# Patient Record
Sex: Female | Born: 1955 | Hispanic: Refuse to answer | Marital: Married | Smoking: Former smoker
Health system: Southern US, Community
[De-identification: ages and names within clinical notes are randomized; demographics above are authoritative.]

## PROBLEM LIST (undated history)

## (undated) DIAGNOSIS — T753XXA Motion sickness, initial encounter: Secondary | ICD-10-CM

## (undated) DIAGNOSIS — R569 Unspecified convulsions: Secondary | ICD-10-CM

## (undated) DIAGNOSIS — M797 Fibromyalgia: Secondary | ICD-10-CM

## (undated) DIAGNOSIS — E785 Hyperlipidemia, unspecified: Secondary | ICD-10-CM

## (undated) DIAGNOSIS — I10 Essential (primary) hypertension: Secondary | ICD-10-CM

## (undated) DIAGNOSIS — R002 Palpitations: Secondary | ICD-10-CM

## (undated) DIAGNOSIS — M069 Rheumatoid arthritis, unspecified: Secondary | ICD-10-CM

## (undated) HISTORY — PX: ABLATION: SHX5711

## (undated) HISTORY — PX: CHOLECYSTECTOMY: SHX55

---

## 2007-12-23 ENCOUNTER — Ambulatory Visit: Payer: Self-pay | Admitting: Family Medicine

## 2010-04-05 ENCOUNTER — Ambulatory Visit: Payer: Self-pay | Admitting: Otolaryngology

## 2010-06-15 ENCOUNTER — Ambulatory Visit: Payer: Self-pay | Admitting: Anesthesiology

## 2010-06-16 ENCOUNTER — Ambulatory Visit: Payer: Self-pay | Admitting: Otolaryngology

## 2010-06-16 HISTORY — PX: NASAL SINUS SURGERY: SHX719

## 2010-06-28 ENCOUNTER — Ambulatory Visit: Payer: Self-pay | Admitting: Cardiovascular Disease

## 2010-08-12 ENCOUNTER — Ambulatory Visit: Payer: Self-pay | Admitting: Internal Medicine

## 2012-05-02 ENCOUNTER — Ambulatory Visit: Payer: Self-pay

## 2012-05-15 IMAGING — CT CT MAXILLOFACIAL WITHOUT CONTRAST
2 of 3 series · 15 of 40 positions shown, 18 images · non-contrast
Comparison: none

REASON FOR EXAM: CHRONIC PAN SINUSITIS
COMMENTS:

[Series 10: (person_name) 1mm · axial · 0.32mm/px · z∈[-61,+60]mm · 12 of 135 slices shown, 15 images]
[im 7/135  brain]
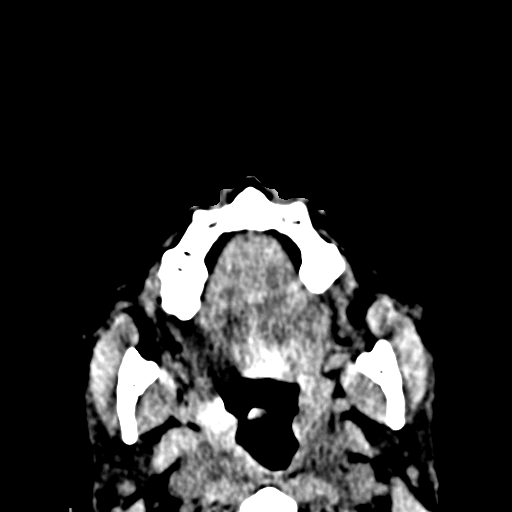
[im 7/135  bone]
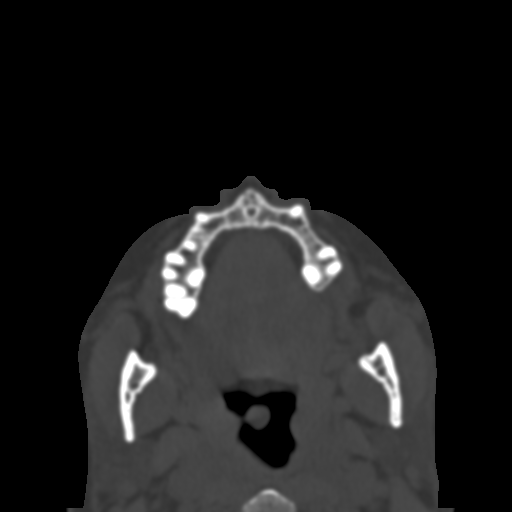
[im 19/135  bone]
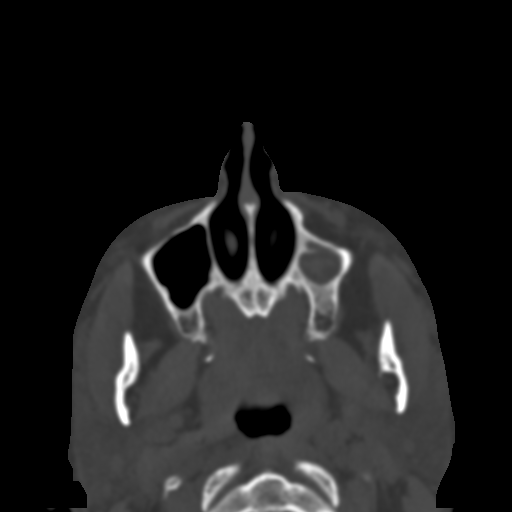
[im 31/135  bone]
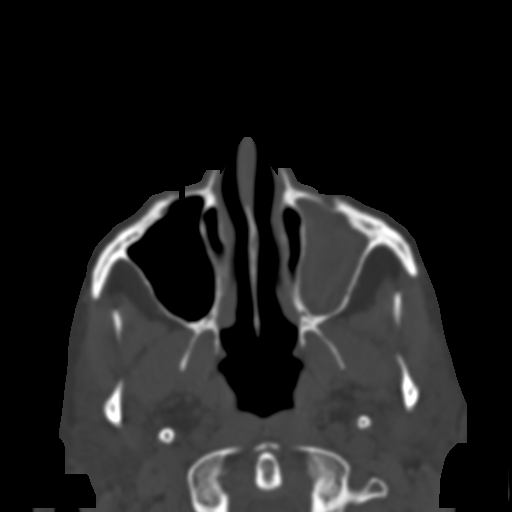
[im 43/135  bone]
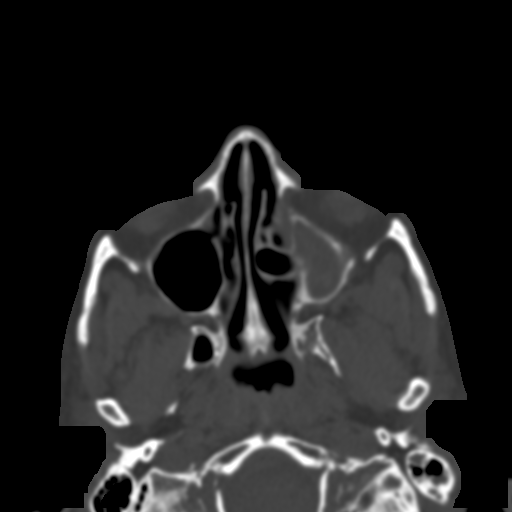
[im 49/135  brain]
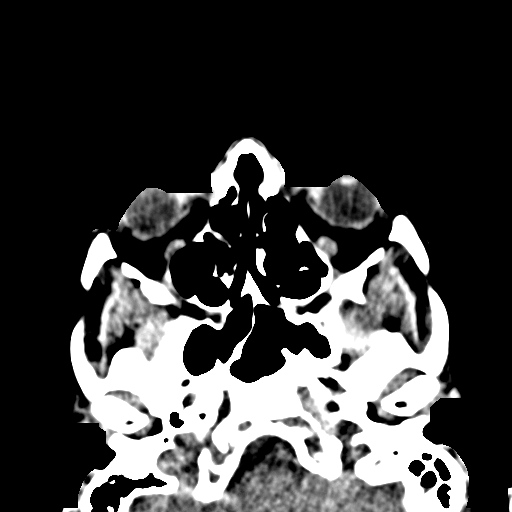
[im 49/135  bone]
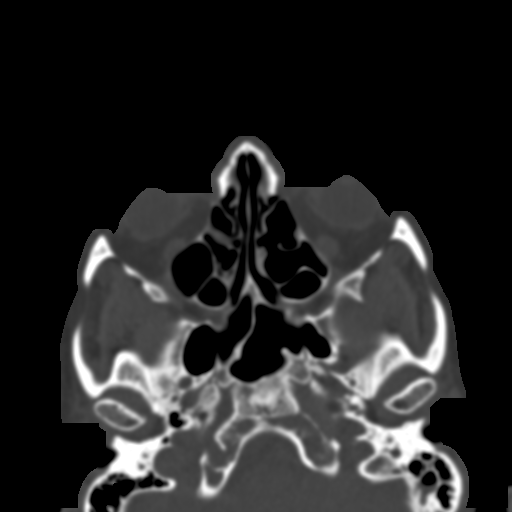
[im 61/135  bone]
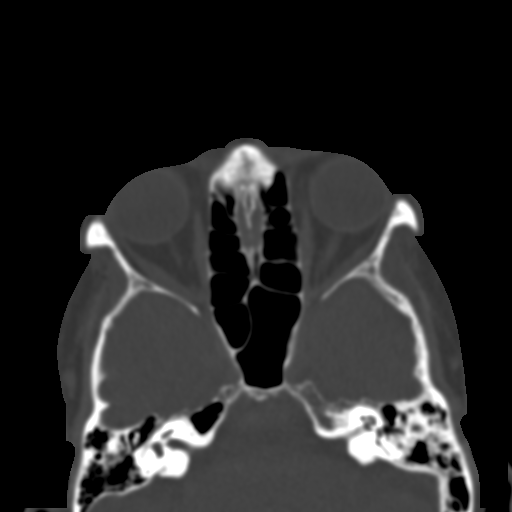
[im 74/135  bone]
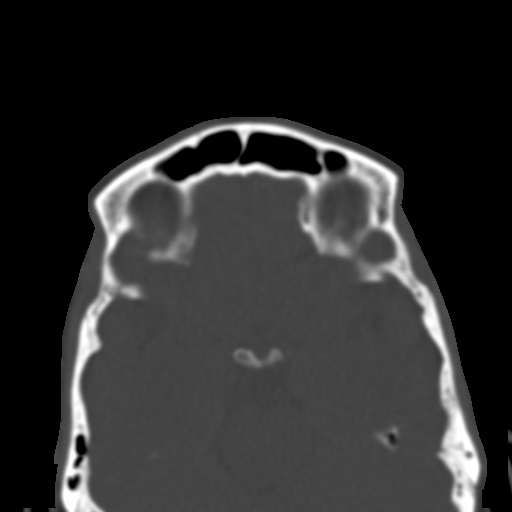
[im 86/135  bone]
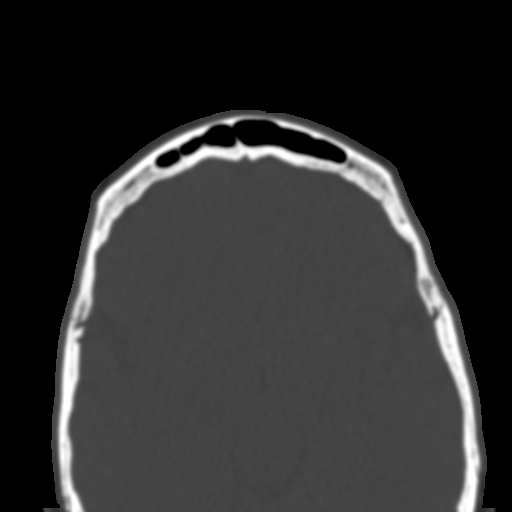
[im 92/135  brain]
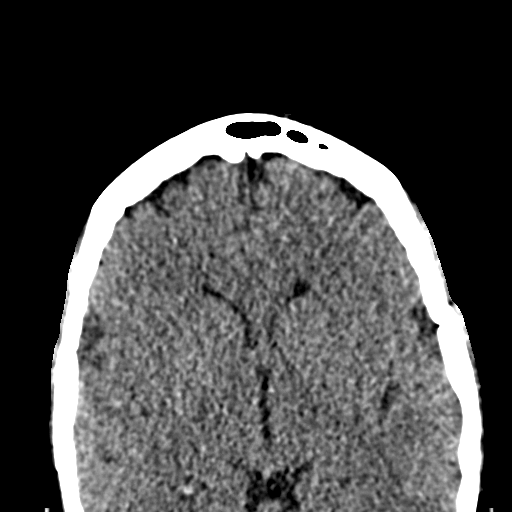
[im 92/135  bone]
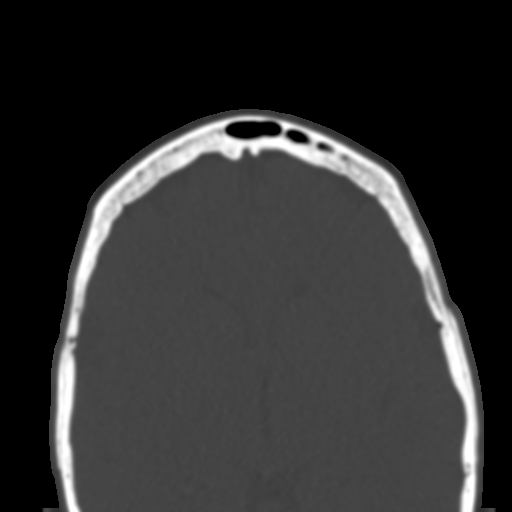
[im 104/135  bone]
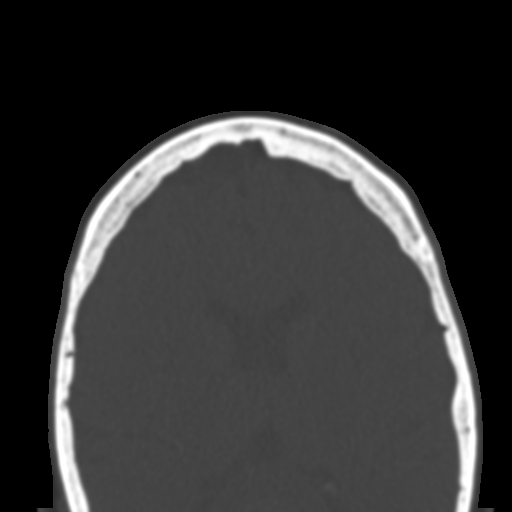
[im 116/135  bone]
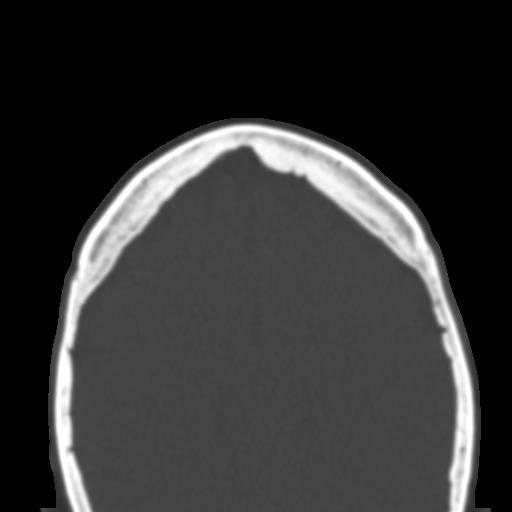
[im 128/135  bone]
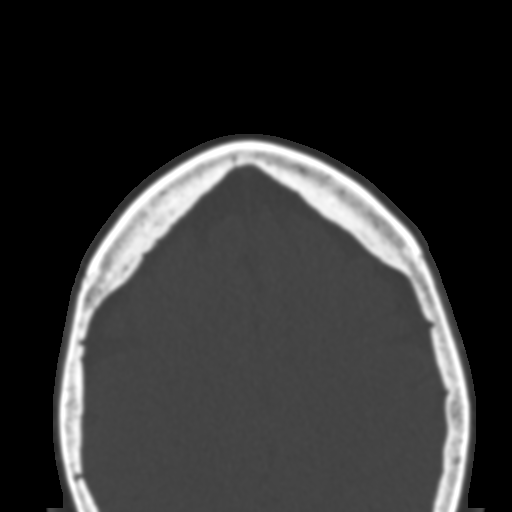

[Series 602: coronals · coronal · 0.32mm/px · 3 of 42 slices shown]
[im 14/42  bone]
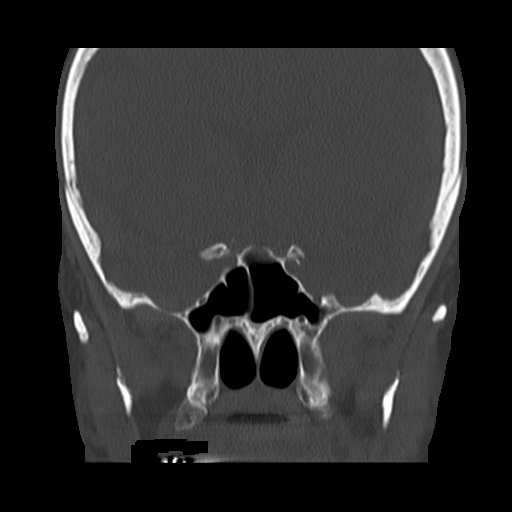
[im 19/42  bone]
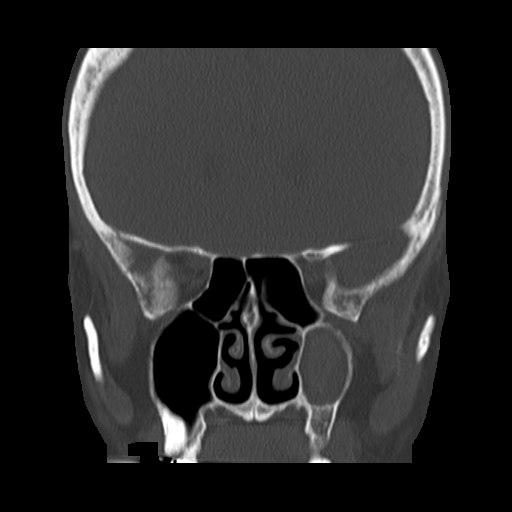
[im 28/42  bone]
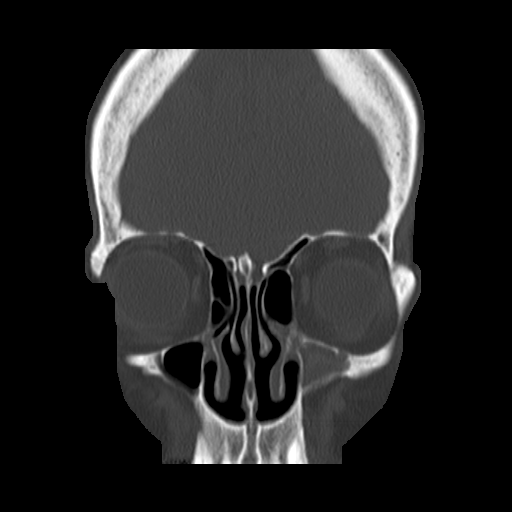

[15 of 40 positions shown; findings below may reference images not displayed]

PROCEDURE:     DAVID - DAVID MAXILLOFACIAL ([facility])  - April 05, 2010  [DATE]

RESULT:     Axial CT scanning was performed from the maxilla through the
frontal sinuses at 3 mm intervals and slice thicknesses using a bone
algorithm. Coronal reconstructions were obtained.

The left maxillary sinus is nearly totally opacified. The material present
is slightly radiodense suggesting that is inspissated and likely chronic.
The right maxillary sinus demonstrates a tiny amount of mucoperiosteal
thickening inferiorly. The frontal, ethmoid, and sphenoid sinuses are clear.
The ostiomeatal unit of the right maxillary sinus is patent while that on
the left contains soft tissue density material. The nasal septum is midline.
IMPRESSION: There is near total opacification of the left maxillary
sinus which may reflect both acute and chronic inflammation. There is a tiny
amount of mucoperiosteal thickening inferiorly in the right maxillary sinus.

## 2013-10-29 ENCOUNTER — Ambulatory Visit: Payer: Self-pay | Admitting: Family Medicine

## 2014-08-10 ENCOUNTER — Ambulatory Visit: Payer: Self-pay | Admitting: Family Medicine

## 2017-05-03 ENCOUNTER — Other Ambulatory Visit: Payer: Self-pay | Admitting: Otolaryngology

## 2017-05-03 DIAGNOSIS — J329 Chronic sinusitis, unspecified: Secondary | ICD-10-CM

## 2017-05-16 ENCOUNTER — Ambulatory Visit
Admission: RE | Admit: 2017-05-16 | Discharge: 2017-05-16 | Disposition: A | Discharge status: Home / Self Care | Payer: Medicare PPO | Source: Ambulatory Visit | Attending: Otolaryngology | Admitting: Otolaryngology

## 2017-05-16 DIAGNOSIS — J329 Chronic sinusitis, unspecified: Secondary | ICD-10-CM | POA: Insufficient documentation

## 2017-05-16 DIAGNOSIS — R238 Other skin changes: Secondary | ICD-10-CM | POA: Diagnosis not present

## 2017-07-05 ENCOUNTER — Ambulatory Visit: Admission: RE | Admit: 2017-07-05 | Payer: Medicare PPO | Source: Ambulatory Visit | Admitting: Otolaryngology

## 2017-07-05 ENCOUNTER — Encounter: Admission: RE | Payer: Self-pay | Source: Ambulatory Visit

## 2017-07-05 SURGERY — SINUS SURGERY, WITH IMAGING GUIDANCE
Anesthesia: General

## 2018-01-18 ENCOUNTER — Other Ambulatory Visit (HOSPITAL_COMMUNITY): Payer: Self-pay | Admitting: Otolaryngology

## 2018-01-18 DIAGNOSIS — J339 Nasal polyp, unspecified: Secondary | ICD-10-CM

## 2018-01-28 ENCOUNTER — Encounter (INDEPENDENT_AMBULATORY_CARE_PROVIDER_SITE_OTHER): Payer: Self-pay

## 2018-01-28 ENCOUNTER — Ambulatory Visit
Admission: RE | Admit: 2018-01-28 | Discharge: 2018-01-28 | Disposition: A | Discharge status: Home / Self Care | Payer: Medicare PPO | Source: Ambulatory Visit | Attending: Otolaryngology | Admitting: Otolaryngology

## 2018-01-28 DIAGNOSIS — J320 Chronic maxillary sinusitis: Secondary | ICD-10-CM | POA: Diagnosis not present

## 2018-01-28 DIAGNOSIS — J339 Nasal polyp, unspecified: Secondary | ICD-10-CM | POA: Insufficient documentation

## 2018-02-13 NOTE — Discharge Instructions (Signed)
[facility] °[facility] °ENDOSCOPIC SINUS SURGERY °[facility] ° °What is Functional Endoscopic Sinus Surgery? ° The Surgery involves making the natural openings of the sinuses larger by removing the bony partitions that separate the sinuses from the nasal cavity.  The natural sinus lining is preserved as much as possible to allow the sinuses to resume normal function after the surgery.  In some patients nasal polyps (excessively swollen lining of the sinuses) may be removed to relieve obstruction of the sinus openings.  The surgery is performed through the nose using lighted scopes, which eliminates the need for incisions on the face.  A septoplasty is a different procedure which is sometimes performed with sinus surgery.  It involves straightening the boy partition that separates the two sides of your nose.  A crooked or deviated septum may need repair if is obstructing the sinuses or nasal airflow.  Turbinate reduction is also often performed during sinus surgery.  The turbinates are bony proturberances from the side walls of the nose which swell and can obstruct the nose in patients with sinus and allergy problems.  Their size can be surgically reduced to help relieve nasal obstruction. ° °What Can Sinus Surgery Do For Me? ° Sinus surgery can reduce the frequency of sinus infections requiring antibiotic treatment.  This can provide improvement in nasal congestion, post-nasal drainage, facial pressure and nasal obstruction.  Surgery will NOT prevent you from ever having an infection again, so it usually only for patients who get infections 4 or more times yearly requiring antibiotics, or for infections that do not clear with antibiotics.  It will not cure nasal allergies, so patients with allergies may still require medication to treat their allergies after surgery. Surgery may improve headaches related to sinusitis, however, some people will continue to  require medication to control sinus headaches related to allergies.  Surgery will do nothing for other forms of headache (migraine, tension or cluster). ° °What Are the Risks of Endoscopic Sinus Surgery? ° Current techniques allow surgery to be performed safely with little risk, however, there are rare complications that patients should be aware of.  Because the sinuses are located around the eyes, there is risk of eye injury, including blindness, though again, this would be quite rare. This is usually a result of bleeding behind the eye during surgery, which puts the vision oat risk, though there are treatments to protect the vision and prevent permanent disrupted by surgery causing a leak of the spinal fluid that surrounds the brain.  More serious complications would include bleeding inside the brain cavity or damage to the brain.  Again, all of these complications are uncommon, and spinal fluid leaks can be safely managed surgically if they occur.  The most common complication of sinus surgery is bleeding from the nose, which may require packing or cauterization of the nose.  Continued sinus have polyps may experience recurrence of the polyps requiring revision surgery.  Alterations of sense of smell or injury to the tear ducts are also rare complications.  ° °What is the Surgery Like, and what is the Recovery? ° The Surgery usually takes a couple of hours to perform, and is usually performed under a general anesthetic (completely asleep).  Patients are usually discharged home after a couple of hours.  Sometimes during surgery it is necessary to pack the nose to control bleeding, and the packing is left in place for 24 - 48 hours, and removed by your surgeon.    If a septoplasty was performed during the procedure, there is often a splint placed which must be removed after 5-7 days.   °Discomfort: Pain is usually mild to moderate, and can be controlled by prescription pain medication or acetaminophen (Tylenol).   Aspirin, Ibuprofen (Advil, Motrin), or Naprosyn (Aleve) should be avoided, as they can cause increased bleeding.  Most patients feel sinus pressure like they have a bad head cold for several days.  Sleeping with your head elevated can help reduce swelling and facial pressure, as can ice packs over the face.  A humidifier may be helpful to keep the mucous and blood from drying in the nose.  ° °Diet: There are no specific diet restrictions, however, you should generally start with clear liquids and a light diet of bland foods because the anesthetic can cause some nausea.  Advance your diet depending on how your stomach feels.  Taking your pain medication with food will often help reduce stomach upset which pain medications can cause. ° °Nasal Saline Irrigation: It is important to remove blood clots and dried mucous from the nose as it is healing.  This is done by having you irrigate the nose at least 3 - 4 times daily with a salt water solution.  We recommend using NeilMed Sinus Rinse (available at the drug store).  Fill the squeeze bottle with the solution, bend over a sink, and insert the tip of the squeeze bottle into the nose ½ of an inch.  Point the tip of the squeeze bottle towards the inside corner of the eye on the same side your irrigating.  Squeeze the bottle and gently irrigate the nose.  If you bend forward as you do this, most of the fluid will flow back out of the nose, instead of down your throat.   The solution should be warm, near body temperature, when you irrigate.   Each time you irrigate, you should use a full squeeze bottle.  ° °Note that if you are instructed to use Nasal Steroid Sprays at any time after your surgery, irrigate with saline BEFORE using the steroid spray, so you do not wash it all out of the nose. °Another product, Nasal Saline Gel (such as AYR Nasal Saline Gel) can be applied in each nostril 3 - 4 times daily to moisture the nose and reduce scabbing or crusting. ° °Bleeding:   Bloody drainage from the nose can be expected for several days, and patients are instructed to irrigate their nose frequently with salt water to help remove mucous and blood clots.  The drainage may be dark red or brown, though some fresh blood may be seen intermittently, especially after irrigation.  Do not blow you nose, as bleeding may occur. If you must sneeze, keep your mouth open to allow air to escape through your mouth. ° °If heavy bleeding occurs: Irrigate the nose with saline to rinse out clots, then spray the nose 3 - 4 times with Afrin Nasal Decongestant Spray.  The spray will constrict the blood vessels to slow bleeding.  Pinch the lower half of your nose shut to apply pressure, and lay down with your head elevated.  Ice packs over the nose may help as well. If bleeding persists despite these measures, you should notify your doctor.  Do not use the Afrin routinely to control nasal congestion after surgery, as it can result in worsening congestion and may affect healing.  ° ° ° °Activity: Return to work varies among patients. Most patients will be   out of work at least 5 - 7 days to recover.  Patient may return to work after they are off of narcotic pain medication, and feeling well enough to perform the functions of their job.  Patients must avoid heavy lifting (over 10 pounds) or strenuous physical for 2 weeks after surgery, so your employer may need to assign you to light duty, or keep you out of work longer if light duty is not possible.  NOTE: you should not drive, operate dangerous machinery, do any mentally demanding tasks or make any important legal or financial decisions while on narcotic pain medication and recovering from the general anesthetic.  °  °Call Your Doctor Immediately if You Have Any of the Following: °1. Bleeding that you cannot control with the above measures °2. Loss of vision, double vision, bulging of the eye or black eyes. °3. Fever over 101 degrees °4. Neck stiffness with  severe headache, fever, nausea and change in mental state. °You are always encourage to call anytime with concerns, however, please call with requests for pain medication refills during office hours. ° °Office Endoscopy: During follow-up visits your doctor will remove any packing or splints that may have been placed and evaluate and clean your sinuses endoscopically.  Topical anesthetic will be used to make this as comfortable as possible, though you may want to take your pain medication prior to the visit.  How often this will need to be done varies from patient to patient.  After complete recovery from the surgery, you may need follow-up endoscopy from time to time, particularly if there is concern of recurrent infection or nasal polyps. ° ° °General Anesthesia, Adult, Care After °These instructions provide you with information about caring for yourself after your procedure. Your health care provider may also give you more specific instructions. Your treatment has been planned according to current medical practices, but problems sometimes occur. Call your health care provider if you have any problems or questions after your procedure. °What can I expect after the procedure? °After the procedure, it is common to have: °· Vomiting. °· A sore throat. °· Mental slowness. ° °It is common to feel: °· Nauseous. °· Cold or shivery. °· Sleepy. °· Tired. °· Sore or achy, even in parts of your body where you did not have surgery. ° °Follow these instructions at home: °For at least 24 hours after the procedure: °· Do not: °? Participate in activities where you could fall or become injured. °? Drive. °? Use heavy machinery. °? Drink alcohol. °? Take sleeping pills or medicines that cause drowsiness. °? Make important decisions or sign legal documents. °? Take care of children on your own. °· Rest. °Eating and drinking °· If you vomit, drink water, juice, or soup when you can drink without vomiting. °· Drink enough fluid to  keep your urine clear or pale yellow. °· Make sure you have little or no nausea before eating solid foods. °· Follow the diet recommended by your health care provider. °General instructions °· Have a responsible adult stay with you until you are awake and alert. °· Return to your normal activities as told by your health care provider. Ask your health care provider what activities are safe for you. °· Take over-the-counter and prescription medicines only as told by your health care provider. °· If you smoke, do not smoke without supervision. °· Keep all follow-up visits as told by your health care provider. This is important. °Contact a health care provider if: °· You   continue to have nausea or vomiting at home, and medicines are not helpful. °· You cannot drink fluids or start eating again. °· You cannot urinate after 8-12 hours. °· You develop a skin rash. °· You have fever. °· You have increasing redness at the site of your procedure. °Get help right away if: °· You have difficulty breathing. °· You have chest pain. °· You have unexpected bleeding. °· You feel that you are having a life-threatening or urgent problem. °This information is not intended to replace advice given to you by your health care provider. Make sure you discuss any questions you have with your health care provider. °Document Released: 10/23/2000 Document Revised: 12/20/2015 Document Reviewed: 07/01/2015 °Elsevier Interactive Patient Education © 2018 [employer] ° °

## 2018-02-18 ENCOUNTER — Encounter: Payer: Self-pay | Admitting: *Deleted

## 2018-02-18 ENCOUNTER — Other Ambulatory Visit: Payer: Self-pay

## 2018-02-19 ENCOUNTER — Other Ambulatory Visit: Payer: Self-pay | Admitting: Otolaryngology

## 2018-02-19 DIAGNOSIS — J329 Chronic sinusitis, unspecified: Secondary | ICD-10-CM

## 2018-02-21 ENCOUNTER — Ambulatory Visit: Payer: Medicare PPO | Admitting: Certified Registered"

## 2018-02-21 ENCOUNTER — Encounter
Admission: RE | Disposition: A | Discharge status: Home / Self Care | Payer: Self-pay | Source: Ambulatory Visit | Attending: Otolaryngology

## 2018-02-21 ENCOUNTER — Ambulatory Visit
Admission: RE | Admit: 2018-02-21 | Discharge: 2018-02-21 | Disposition: A | Discharge status: Home / Self Care | Payer: Medicare PPO | Source: Ambulatory Visit | Attending: Otolaryngology | Admitting: Otolaryngology

## 2018-02-21 DIAGNOSIS — Z888 Allergy status to other drugs, medicaments and biological substances status: Secondary | ICD-10-CM | POA: Diagnosis not present

## 2018-02-21 DIAGNOSIS — J320 Chronic maxillary sinusitis: Secondary | ICD-10-CM | POA: Insufficient documentation

## 2018-02-21 DIAGNOSIS — J322 Chronic ethmoidal sinusitis: Secondary | ICD-10-CM | POA: Insufficient documentation

## 2018-02-21 DIAGNOSIS — M069 Rheumatoid arthritis, unspecified: Secondary | ICD-10-CM | POA: Insufficient documentation

## 2018-02-21 DIAGNOSIS — Z823 Family history of stroke: Secondary | ICD-10-CM | POA: Insufficient documentation

## 2018-02-21 DIAGNOSIS — F329 Major depressive disorder, single episode, unspecified: Secondary | ICD-10-CM | POA: Diagnosis not present

## 2018-02-21 DIAGNOSIS — Z87891 Personal history of nicotine dependence: Secondary | ICD-10-CM | POA: Diagnosis not present

## 2018-02-21 DIAGNOSIS — M797 Fibromyalgia: Secondary | ICD-10-CM | POA: Insufficient documentation

## 2018-02-21 DIAGNOSIS — J321 Chronic frontal sinusitis: Secondary | ICD-10-CM | POA: Diagnosis not present

## 2018-02-21 DIAGNOSIS — Z79899 Other long term (current) drug therapy: Secondary | ICD-10-CM | POA: Diagnosis not present

## 2018-02-21 HISTORY — DX: Fibromyalgia: M79.7

## 2018-02-21 HISTORY — DX: Rheumatoid arthritis, unspecified: M06.9

## 2018-02-21 HISTORY — PX: ETHMOIDECTOMY: SHX5197

## 2018-02-21 HISTORY — DX: Unspecified convulsions: R56.9

## 2018-02-21 HISTORY — DX: Palpitations: R00.2

## 2018-02-21 HISTORY — PX: IMAGE GUIDED SINUS SURGERY: SHX6570

## 2018-02-21 HISTORY — PX: MAXILLARY ANTROSTOMY: SHX2003

## 2018-02-21 HISTORY — DX: Motion sickness, initial encounter: T75.3XXA

## 2018-02-21 SURGERY — SINUS SURGERY, WITH IMAGING GUIDANCE
Anesthesia: General | Site: Nose | Wound class: Clean Contaminated

## 2018-02-21 MED ORDER — DEXTROSE 5 % IV SOLN
2000.0000 mg | Freq: Once | INTRAVENOUS | Status: AC
  Administered 2018-02-21: 2000 mg via INTRAVENOUS

## 2018-02-21 MED ORDER — ONDANSETRON HCL 4 MG/2ML IJ SOLN
INTRAMUSCULAR | Status: DC | PRN
  Administered 2018-02-21: 4 mg via INTRAVENOUS

## 2018-02-21 MED ORDER — EPHEDRINE SULFATE 50 MG/ML IJ SOLN
INTRAMUSCULAR | Status: DC | PRN
  Administered 2018-02-21: 5 mg via INTRAVENOUS
  Administered 2018-02-21: 10 mg via INTRAVENOUS

## 2018-02-21 MED ORDER — OXYMETAZOLINE HCL 0.05 % NA SOLN
2.0000 | Freq: Once | NASAL | Status: AC
  Administered 2018-02-21: 2 via NASAL

## 2018-02-21 MED ORDER — ROCURONIUM BROMIDE 100 MG/10ML IV SOLN
INTRAVENOUS | Status: DC | PRN
  Administered 2018-02-21: 25 mg via INTRAVENOUS

## 2018-02-21 MED ORDER — PROMETHAZINE HCL 25 MG/ML IJ SOLN: 6.2500 mg | INTRAMUSCULAR | Status: DC | PRN

## 2018-02-21 MED ORDER — PHENYLEPHRINE HCL 0.5 % NA SOLN
NASAL | Status: DC | PRN
  Administered 2018-02-21: 30 mL via TOPICAL

## 2018-02-21 MED ORDER — DEXAMETHASONE SODIUM PHOSPHATE 4 MG/ML IJ SOLN
INTRAMUSCULAR | Status: DC | PRN
  Administered 2018-02-21: 10 mg via INTRAVENOUS

## 2018-02-21 MED ORDER — LACTATED RINGERS IV SOLN
INTRAVENOUS | Status: DC
  Administered 2018-02-21: 12:00:00 via INTRAVENOUS

## 2018-02-21 MED ORDER — MIDAZOLAM HCL 5 MG/5ML IJ SOLN
INTRAMUSCULAR | Status: DC | PRN
  Administered 2018-02-21: 1 mg via INTRAVENOUS

## 2018-02-21 MED ORDER — LIDOCAINE-EPINEPHRINE 1 %-1:100000 IJ SOLN
INTRAMUSCULAR | Status: DC | PRN
  Administered 2018-02-21: 4 mL

## 2018-02-21 MED ORDER — GLYCOPYRROLATE 0.2 MG/ML IJ SOLN
INTRAMUSCULAR | Status: DC | PRN
  Administered 2018-02-21: .1 mg via INTRAVENOUS

## 2018-02-21 MED ORDER — OXYCODONE HCL 5 MG/5ML PO SOLN: 5.0000 mg | Freq: Once | ORAL | Status: AC | PRN

## 2018-02-21 MED ORDER — FENTANYL CITRATE (PF) 100 MCG/2ML IJ SOLN
25.0000 ug | INTRAMUSCULAR | Status: DC | PRN
  Administered 2018-02-21: 50 ug via INTRAVENOUS

## 2018-02-21 MED ORDER — PROPOFOL 10 MG/ML IV BOLUS
INTRAVENOUS | Status: DC | PRN
  Administered 2018-02-21: 100 mg via INTRAVENOUS

## 2018-02-21 MED ORDER — OXYCODONE HCL 5 MG PO TABS
5.0000 mg | ORAL_TABLET | Freq: Once | ORAL | Status: AC | PRN
  Administered 2018-02-21: 5 mg via ORAL

## 2018-02-21 MED ORDER — LIDOCAINE HCL (CARDIAC) PF 100 MG/5ML IV SOSY
PREFILLED_SYRINGE | INTRAVENOUS | Status: DC | PRN
  Administered 2018-02-21: 50 mg via INTRAVENOUS

## 2018-02-21 MED ORDER — ACETAMINOPHEN 10 MG/ML IV SOLN
1000.0000 mg | Freq: Once | INTRAVENOUS | Status: AC
  Administered 2018-02-21: 1000 mg via INTRAVENOUS

## 2018-02-21 MED ORDER — LACTATED RINGERS IV SOLN: INTRAVENOUS | Status: DC

## 2018-02-21 SURGICAL SUPPLY — 30 items
BATTERY INSTRU NAVIGATION (MISCELLANEOUS) ×15 IMPLANT
CANISTER SUCT 1200ML W/VALVE (MISCELLANEOUS) ×5 IMPLANT
CATH IV 18X1 1/4 SAFELET (CATHETERS) ×5 IMPLANT
COAGULATOR SUCT 8FR VV (MISCELLANEOUS) ×5 IMPLANT
DRAPE HEAD BAR (DRAPES) ×5 IMPLANT
ELECT REM PT RETURN 9FT ADLT (ELECTROSURGICAL) ×5
ELECTRODE REM PT RTRN 9FT ADLT (ELECTROSURGICAL) ×3 IMPLANT
GLOVE PI ULTRA LF STRL 7.5 (GLOVE) ×9 IMPLANT
GLOVE PI ULTRA NON LATEX 7.5 (GLOVE) ×6
IMPL PROPEL CONTOUR (Prosthesis and Implant ENT) ×3 IMPLANT
IMPLANT PROPEL CONTOUR (Prosthesis and Implant ENT) ×5 IMPLANT
IV CATH 18X1 1/4 SAFELET (CATHETERS) ×3
IV NS 500ML (IV SOLUTION) ×2
IV NS 500ML BAXH (IV SOLUTION) ×3 IMPLANT
KIT TURNOVER KIT A (KITS) ×5 IMPLANT
NEEDLE ANESTHESIA  27G X 3.5 (NEEDLE) ×2
NEEDLE ANESTHESIA 27G X 3.5 (NEEDLE) ×3 IMPLANT
NS IRRIG 500ML POUR BTL (IV SOLUTION) ×5 IMPLANT
PACK DRAPE NASAL/ENT (PACKS) ×5 IMPLANT
PACKING NASAL EPIS 4X2.4 XEROG (MISCELLANEOUS) ×10 IMPLANT
PATTIES SURGICAL .5 X3 (DISPOSABLE) ×5 IMPLANT
SHAVER DIEGO BLD STD TYPE A (BLADE) ×5 IMPLANT
SOL ANTI-FOG 6CC FOG-OUT (MISCELLANEOUS) ×3 IMPLANT
SOL FOG-OUT ANTI-FOG 6CC (MISCELLANEOUS) ×2
STRAP BODY AND KNEE 60X3 (MISCELLANEOUS) ×5 IMPLANT
SYR 3ML LL SCALE MARK (SYRINGE) ×5 IMPLANT
TOWEL OR 17X26 4PK STRL BLUE (TOWEL DISPOSABLE) ×5 IMPLANT
TRACKER CRANIALMASK (MASK) ×5 IMPLANT
TUBING DECLOG MULTIDEBRIDER (TUBING) ×5 IMPLANT
WATER STERILE IRR 250ML POUR (IV SOLUTION) ×5 IMPLANT

## 2018-02-21 NOTE — Transfer of Care (Signed)
Immediate Anesthesia Transfer of Care Note  Patient: Heather Salas  Procedure(s) Performed: IMAGE GUIDED SINUS SURGERY gave CeCe disk on 02/15/18 DS (N/A Nose) LEFT MAXILLARY ANTROSTOMY WITH REMOVAL OF TISSUE FROM SINUS (Left Nose) BILATERAL TOTAL ETHMOIDECTOMY WITH FRONTAL SINUS EXPLORATION (Bilateral Nose)  Patient Location: PACU  Anesthesia Type: General  Level of Consciousness: awake, alert  and patient cooperative  Airway and Oxygen Therapy: Patient Spontanous Breathing and Patient connected to supplemental oxygen  Post-op Assessment: Post-op Vital signs reviewed, Patient's Cardiovascular Status Stable, Respiratory Function Stable, Patent Airway and No signs of Nausea or vomiting  Post-op Vital Signs: Reviewed and stable  Complications: No apparent anesthesia complications

## 2018-02-21 NOTE — Anesthesia Procedure Notes (Signed)
Procedure Name: Intubation Date/Time: 02/21/2018 12:24 PM Performed by: Jimmy Picket, CRNA Pre-anesthesia Checklist: Patient identified, Emergency Drugs available, Suction available, Patient being monitored and Timeout performed Patient Re-evaluated:Patient Re-evaluated prior to induction Oxygen Delivery Method: Circle system utilized Preoxygenation: Pre-oxygenation with 100% oxygen Induction Type: IV induction Ventilation: Mask ventilation without difficulty Laryngoscope Size: Miller and 2 Grade View: Grade I Tube type: Oral Rae Tube size: 7.0 mm Number of attempts: 1 Placement Confirmation: ETT inserted through vocal cords under direct vision,  positive ETCO2 and breath sounds checked- equal and bilateral Tube secured with: Tape Dental Injury: Teeth and Oropharynx as per pre-operative assessment

## 2018-02-21 NOTE — H&P (Signed)
H&P has been reviewed, and patient reevaluated, and no changes necessary. To be downloaded later.  

## 2018-02-21 NOTE — Anesthesia Preprocedure Evaluation (Signed)
Anesthesia Evaluation  Patient identified by MRN, date of birth, ID band Patient awake    Reviewed: Allergy & Precautions, NPO status , Patient's Chart, lab work & pertinent test results  Airway Mallampati: II  TM Distance: >3 FB Neck ROM: Full    Dental no notable dental hx.    Pulmonary neg pulmonary ROS, former smoker,    Pulmonary exam normal breath sounds clear to auscultation       Cardiovascular negative cardio ROS Normal cardiovascular exam Rhythm:Regular Rate:Normal     Neuro/Psych Seizures -,  negative neurological ROS  negative psych ROS   GI/Hepatic negative GI ROS, Neg liver ROS,   Endo/Other  negative endocrine ROS  Renal/GU negative Renal ROS  negative genitourinary   Musculoskeletal negative musculoskeletal ROS (+) Arthritis , Rheumatoid disorders,  Fibromyalgia -  Abdominal   Peds negative pediatric ROS (+)  Hematology negative hematology ROS (+)   Anesthesia Other Findings   Reproductive/Obstetrics negative OB ROS                             Anesthesia Physical Anesthesia Plan  ASA: II  Anesthesia Plan: General   Post-op Pain Management:    Induction: Intravenous  PONV Risk Score and Plan:   Airway Management Planned: Oral ETT  Additional Equipment:   Intra-op Plan:   Post-operative Plan: Extubation in OR  Informed Consent: I have reviewed the patients History and Physical, chart, labs and discussed the procedure including the risks, benefits and alternatives for the proposed anesthesia with the patient or authorized representative who has indicated his/her understanding and acceptance.   Dental advisory given  Plan Discussed with: CRNA  Anesthesia Plan Comments:         Anesthesia Quick Evaluation

## 2018-02-21 NOTE — Anesthesia Postprocedure Evaluation (Signed)
Anesthesia Post Note  Patient: Heather Salas  Procedure(s) Performed: IMAGE GUIDED SINUS SURGERY gave CeCe disk on 02/15/18 DS (N/A Nose) LEFT MAXILLARY ANTROSTOMY WITH REMOVAL OF TISSUE FROM SINUS (Left Nose) BILATERAL TOTAL ETHMOIDECTOMY WITH FRONTAL SINUS EXPLORATION (Bilateral Nose)  Patient location during evaluation: PACU Anesthesia Type: General Level of consciousness: awake and alert Pain management: pain level controlled Vital Signs Assessment: post-procedure vital signs reviewed and stable Respiratory status: spontaneous breathing, nonlabored ventilation, respiratory function stable and patient connected to nasal cannula oxygen Cardiovascular status: blood pressure returned to baseline and stable Postop Assessment: no apparent nausea or vomiting Anesthetic complications: no    Evangelynn Lochridge C

## 2018-02-21 NOTE — Op Note (Signed)
02/21/2018  1:58 PM    Morison, Graciella Belton  [id number]   Pre-Op Dx: Chronic bilateral ethmoid sinusitis, chronic bilateral frontal sinusitis, chronic left maxillary sinusitis  Post-op Dx: Chronic bilateral ethmoid sinusitis, chronic bilateral frontal sinusitis, chronic bilateral maxillary sinusitis  Proc: Bilateral endoscopic total ethmoidectomy with frontal sinus exploration, left endoscopic maxillary antrostomy with removal of contents, right endoscopic maxillary antrostomy, use of image guided system  Surg:  Beverly Sessions Sue Mcalexander  Anes:  GOT  EBL: 100 mL  Comp: None  Findings: Very thickened inflamed mucous membranes in the left maxillary ethmoid and frontal sinus.  There was thick purulence and swollen tissue in the left maxillary sinus that had to be cleaned out.    Procedure: The patient was brought to the operating room and placed in the supine position.  She was given general anesthesia by oral endotracheal intubation.  Her nose was visualized and cottonoid pledgets soaked in phenylephrine and Xylocaine were placed in the nose on both sides.  The image guided system was brought in and the CT scan was downloaded from the disc.  The template was applied to the face.  The template was then registered to the system and there is 0.8 mm of variance.  The suction instruments were then registered and there was good alignment between the CT scan and the instruments.  The patient was then prepped and draped in sterile fashion.  The cotton pledgets were then removed and the 0 degrees scope was used to visualize the nose.  The uncinate process was infiltrated with 1% Xylocaine with epi 1: 100,000 along with the middle turbinates on both sides.  The left side was addressed first.  The middle turbinate was medialized but was very thick.  It had polypoid changes along it and some of the lateral wall of the middle turbinate was trimmed.  The patient had a very large ethmoid bulla that had polypoid changes  all over it and this was removed and sent for specimen.  The maxillary antrum was then opened by using a side biter to remove the uncinate process.  This removed all the way to the anterior tip of the middle turbinate.  Maxillary antrum was widened using the microdebrider and the image guided system was used to evaluate the opening and making sure that the entire natural ostium was included.  There is thick mucopurulence in the maxillary sinus as well as a lot of thickened tissues around the opening to the sinus.  Using the microdebrider and through biting forceps much of this was trimmed out and the mucus was all suctioned from the sinus using a curved suction.  The posterior ethmoid was then opened widely using the image guided system to evaluate the depth of dissection to make sure it was cleared.  The sphenoid sinus did not need to be open.  The 0 degrees scope was used for this and then was switched to a 30 degrees scope for removing the rest of the middle ethmoid air cells and following the fovea ethmoidalis anteriorly.  The anterior ethmoid air cells were opened.  Mucous membranes were very thickened and the bone was very thick throughout the ethmoid from the chronic inflammation.  There is a frontal sinus duct that I could follow up into the frontal sinus and this was opened further around it to provide good drainage down into the ethmoid.  There was a anterior lateral tract it was opened up back to the area but this ended up being a blind tract  that stopped there.  There is no other medial or inferior tracts that are visible.  With the sinuses opened up completely cottonoid pledgets were placed here temporarily for vasoconstriction.  The right side was then addressed using the 0 degrees scope.  Begin the middle turbinate was medialized and the uncinate process was quite large and folded over the ethmoid.  The uncinate process was removed and there was thickening around the maxillary antral openings of  this was widened to make sure this did not get obstructed.  Maxillary antrum was cleaned using the microdebrider and backbiting forceps.  The 30 degrees scope was used to look in it and there is no evidence of disease further down in the sinus.  The 0 degrees scope was then used with image guided system to open up the ethmoid bulla and clean out the posterior ethmoid air cells back to the sphenoid sinus.  The sphenoid was not entered as it was not involved.  The dissection was then carried anterior along the fovea ethmoidalis and the middle ethmoid air cells were opened using the 30 degrees scope.  The image guided system was used to follow along the fovea ethmoidalis to make sure all the air cells were clean.  The 30 and 70 degrees scopes were then used to open up the anterior ethmoid air cells.  There was a good opening into the frontal sinus duct on this side that I widened.  There was no thickening of the mucous membranes at the opening the frontal sinus duct on the right.  Once this was all open the cottonoid pledgets were placed on this side for vasoconstriction.  The left side was revisualized with the 0 30 and 70 degrees scopes and there was no further disease noted.  It is felt that there is a lot of inflammation going in the frontal sinus duct and a small propel contour stent was placed into the frontal sinus duct to help keep this open and settle down inflammation.  The anterior ethmoid air cells were then filled with xerogel as well as the posterior ethmoid air cells.  The right side was then visualized again with the 0, 30, and 70 degrees scopes.  All the sinuses were opened.  Xerogel was placed in the anterior ethmoid and then further in the posterior ethmoid.  These were all liquefied on both sides and there is no significant bleeding.  Her nasal airways were clear.  The patient tolerated the procedure well.  She was awakened and taken to the recovery room in satisfactory condition.  There were no  operative complications.  Dispo:   To PACU to be discharged home.  Plan: She will follow-up in the office in 1 week to make sure she is doing well and the sinuses are clearing out.  She will use saline flushes at home and is given specific postop instructions.  Beverly Sessions Leonor Darnell  02/21/2018 1:58 PM

## 2018-02-22 ENCOUNTER — Encounter: Payer: Self-pay | Admitting: Otolaryngology

## 2018-02-25 LAB — SURGICAL PATHOLOGY

## 2019-06-26 IMAGING — CT CT MAXILLOFACIAL W/O CM
3 series · 15 of 47 positions shown, 18 images · non-contrast
Comparison: 04/05/2010

CLINICAL DATA: Chronic sinusitis. Left-sided headache and pressure
for months. History of left-sided sinus surgery 6 years ago. History
of nasal cavity polyp.

EXAM:
CT MAXILLOFACIAL WITHOUT CONTRAST
TECHNIQUE: Multidetector CT images of the paranasal sinuses were obtained using
the standard protocol without intravenous contrast.

[Series 2: max soft (person_name) · axial · 0.33mm/px · z∈[+1240,+1348]mm · 9 of 64 slices shown, 12 images]
[im 5/64  brain]
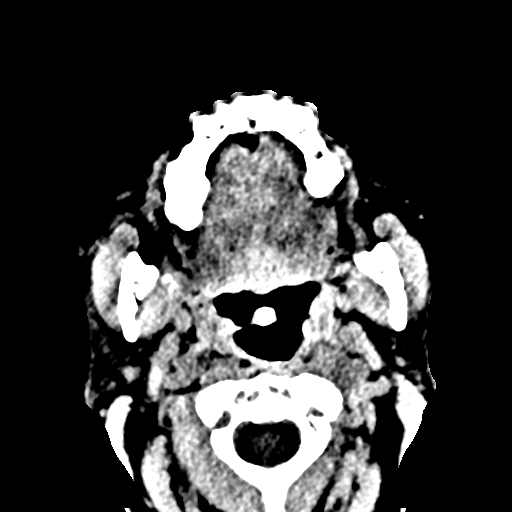
[im 5/64  bone]
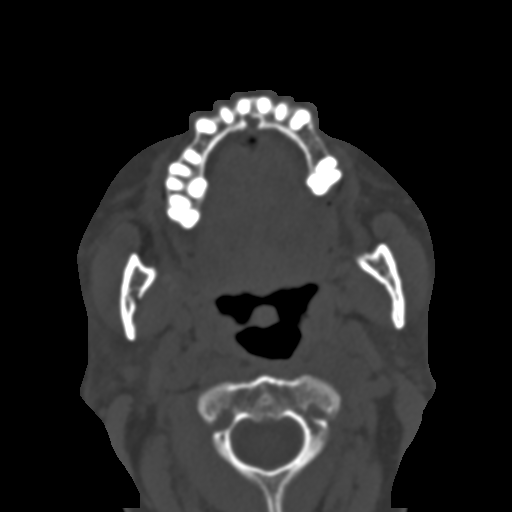
[im 11/64  bone]
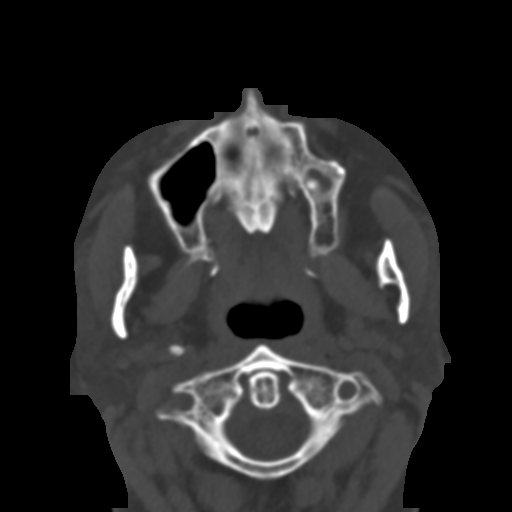
[im 18/64  bone]
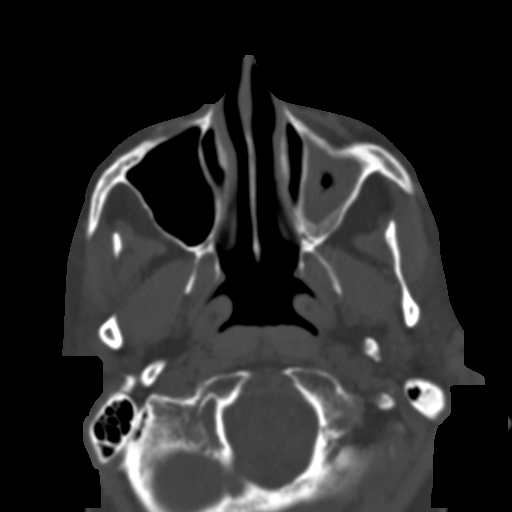
[im 24/64  bone]
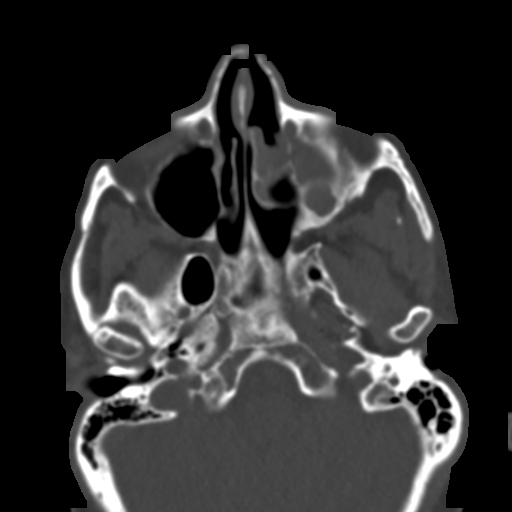
[im 33/64  brain]
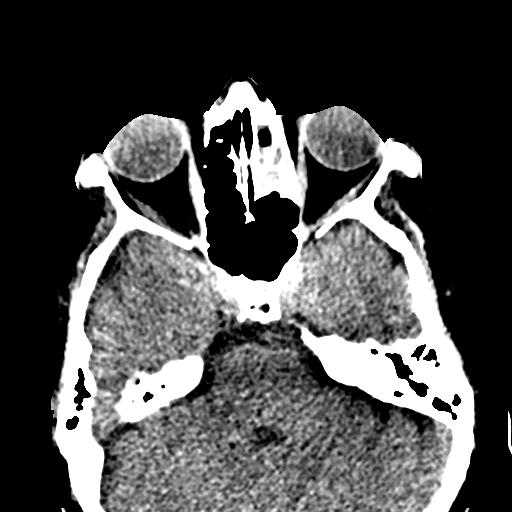
[im 33/64  bone]
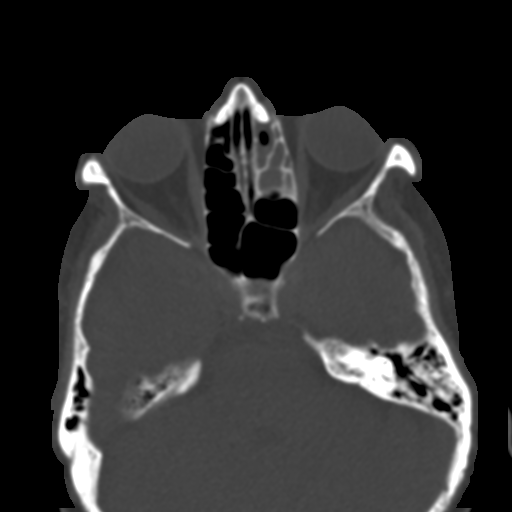
[im 40/64  bone]
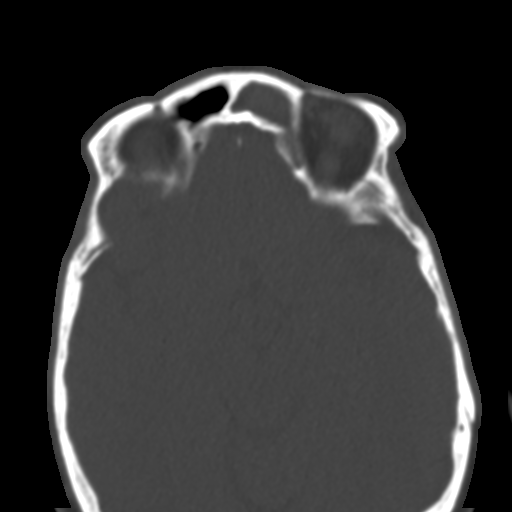
[im 46/64  bone]
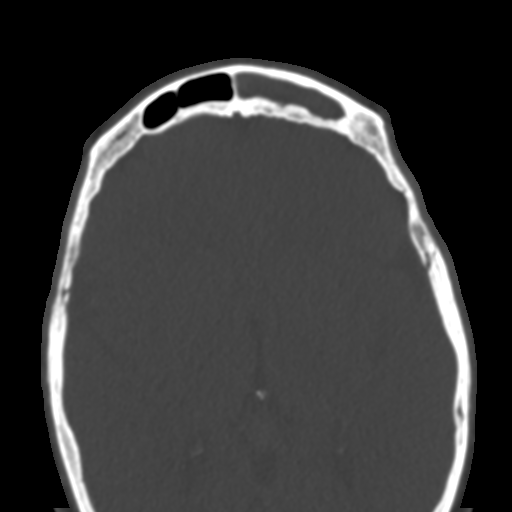
[im 53/64  bone]
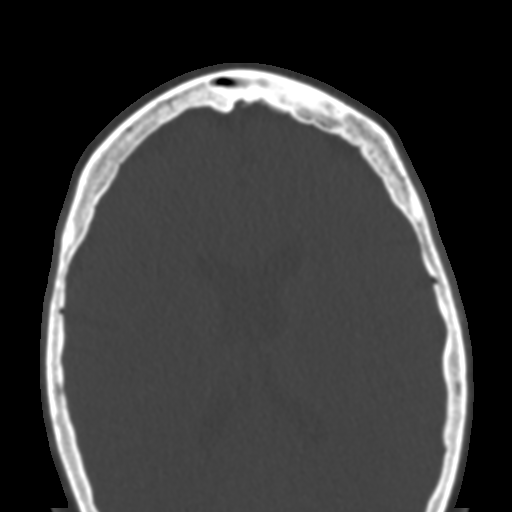
[im 59/64  brain]
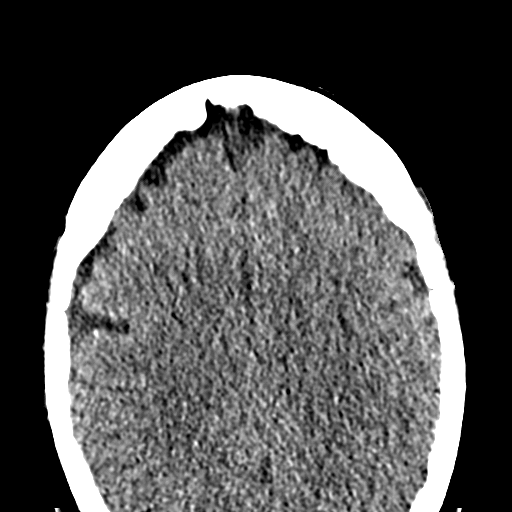
[im 59/64  bone]
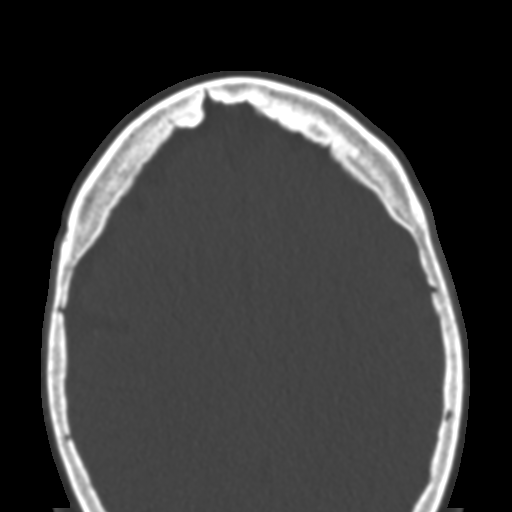

[Series 6: coronal soft · coronal · 0.29mm/px · 3 of 84 slices shown]
[im 28/84  bone]
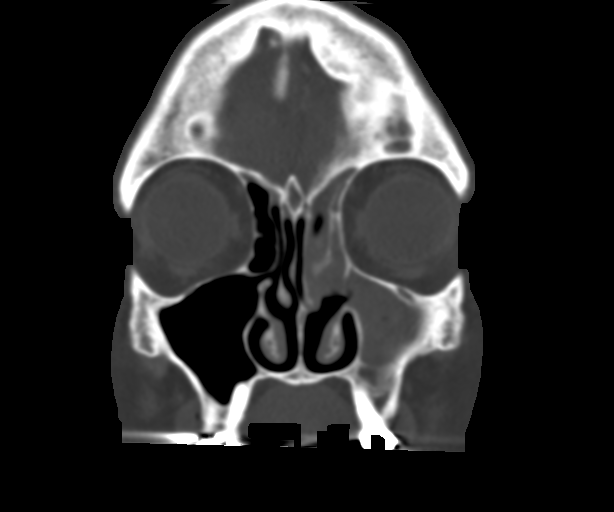
[im 37/84  bone]
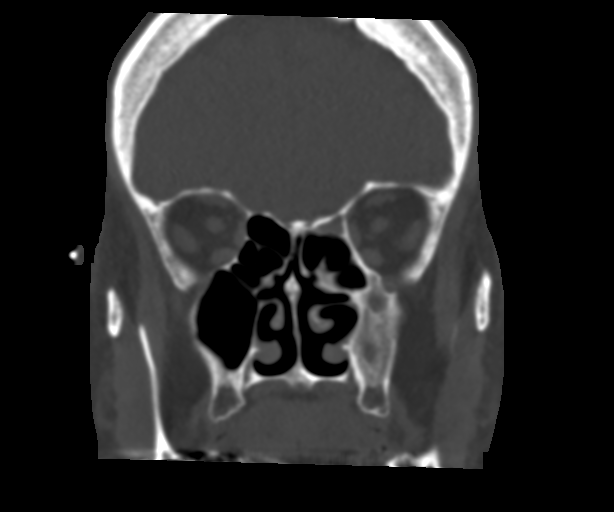
[im 47/84  bone]
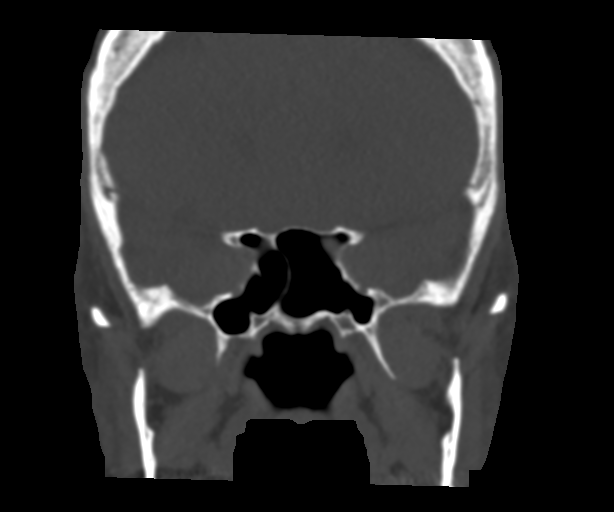

[Series 8: sagittal soft · sagittal · 0.29mm/px · 3 of 86 slices shown]
[im 29/86  bone]
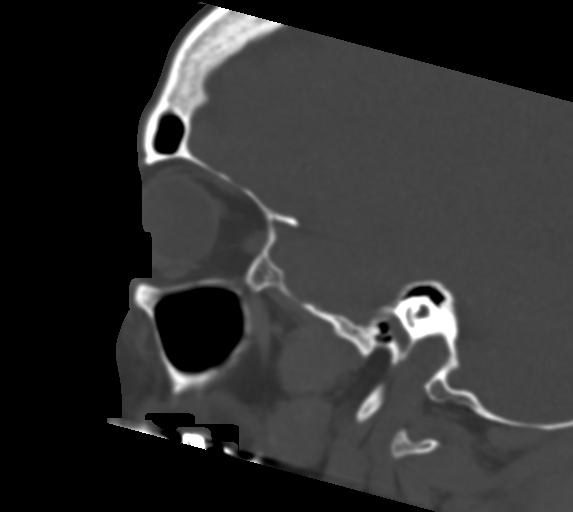
[im 43/86  bone]
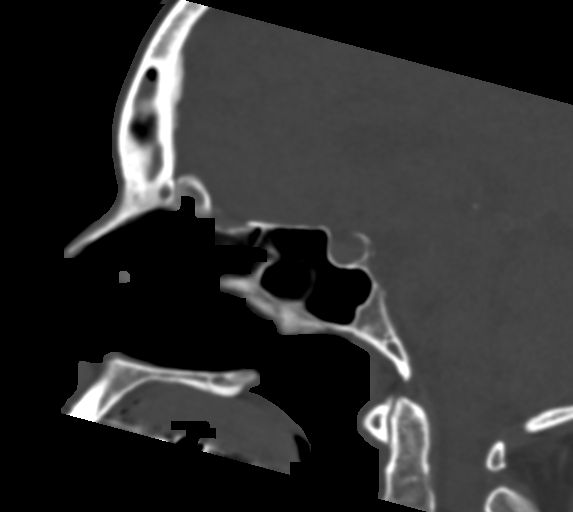
[im 57/86  bone]
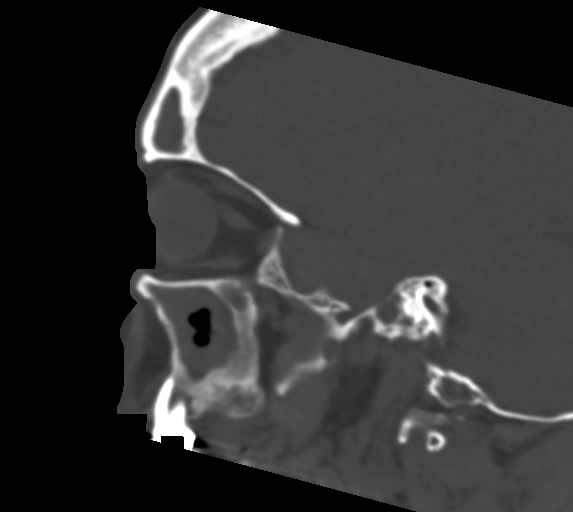

[15 of 47 positions shown; findings below may reference images not displayed]

FINDINGS: Paranasal sinuses:

Frontal: Near complete opacification of the left frontal sinus. No
expansion or dehiscence.

Ethmoid: Asymmetric left anterior ethmoid sinusitis.

Maxillary: Near complete opacification of the left maxillary sinus
which is thick walled and atelectatic from chronic obstruction.
Clear right maxillary sinus

Sphenoid: Normally aerated. Patent sphenoethmoidal recesses.

Right ostiomeatal unit:Patent.

Left ostiomeatal unit: Obstructed upper left middle meatus. The
ethmoid bulla is particularly large and extends infraorbital.
Possible antrostomy in the interim on the left, which is opacified.

Nasal passages: History of nasal cavity polyp which is not clearly
seen. Intact nasal septum is midline.

Anatomy: Small volume pneumatization above the left orbit at the
level of the anterior ethmoid notch. This portion of the sinus is
opacified. Intact medial orbital walls. Sellar sphenoid
pneumatization pattern.

Other: Bilateral cataract resection. No significant intracranial
finding.
IMPRESSION: 1.  Left chronic sinusitis from obstructed middle meatus.
2. Left maxillary antrostomy may have occurred since 2855
comparison, with complete opacification at this level. Expansive
left ethmoid bulla.
3. Left infra and supraorbital pneumatization.
4. History of nasal cavity polyp which is not discretely visualized
today.

## 2021-01-13 ENCOUNTER — Other Ambulatory Visit: Payer: Self-pay | Admitting: Family Medicine

## 2021-01-13 DIAGNOSIS — Z1231 Encounter for screening mammogram for malignant neoplasm of breast: Secondary | ICD-10-CM

## 2023-01-18 NOTE — Progress Notes (Signed)
 Chief Complaint:   Chief Complaint  Patient presents with  . Medication Refill   Subjective  Heather Salas is a 67 y.o. female in today for:  Seizure disorder Essential Tremor - seeing neuro, last 04/2022, on Keppra . Cant remember last seizure  - taking klonopin  0.5mg  BID-TID for tremor (but states she is only taking once a day because it does not seem to be helping), managed by neuro  GAD and MDD with insomnia - on cymbalta  60mg , Seroquel was stopped by neurology. Started risperdal 0.5mg  nightly for anxiety and sleep     Fibromyalgia - feels like the fibromyalgia is getting worse - using cymbalta  60mg  and gabapentin 300mg  BID  Rheumatoid arthritis - diagnosed at 67 years old, sees the rheumatologist Dr. Kernodle - Enbrel swtiched to Rinvoq  which has been better. taking weekly, also on methotrexate 8 pills once a week + folic acid - feels like fibromyalgia is worse than the rheumatoid  - has not seen rheum in a while   HTN Palpitation -  Previously saw cardio  - takes metoprolol  12.5mg  XL, advised by cardio   Very overdue for pap smear. Requesting gyn referral for vaginal discharge. Declines exam today as she had been seeing gyn previously for this Due for mammo Due for [facility]  Patient Active Problem List  Diagnosis  . Seizure disorder (CMS-HCC)  . Rheumatoid arthritis(714.0)  . Carpal tunnel syndrome, bilateral  . Fatigue  . Encounter for long-term (current) use of medications  . Heart palpitations  . Fibromyalgia  . Reflux  . Corneal perforation  . S/P cataract surgery  . Pseudophakia of right eye  . Cataract  . Mixed hyperlipidemia  . Fall with injury  . RA (rheumatoid arthritis) (CMS/HHS-HCC)  . Palpitations  . Benign essential HTN  . Tremor  . Anxiety, generalized  . Seizure (CMS/HHS-HCC)  . History of depression    Outpatient Medications Prior to Visit  Medication Sig Dispense Refill  . CARBOXYMETHYLCELLULOSE SODIUM (REFRESH OPHTH) Apply to eye. One  drop daily ou. Stopped using in the left eye due to burning sensation    . clonazePAM  (KLONOPIN ) 0.5 MG tablet TAKE ONE TABLET BY MOUTH UP TO THREE TIMES DAILY ONLY as needed for ANXIETY OR tremor 90 tablet 2  . DULoxetine  (CYMBALTA ) 60 MG DR capsule TAKE 1 CAPSULE BY MOUTH ONCE  DAILY 90 capsule 3  . folic acid (FOLVITE) 1 MG tablet TAKE 1 TABLET BY MOUTH ONCE  DAILY 90 tablet 3  . melatonin 1 mg tablet Take by mouth    . methotrexate (RHEUMATREX) 2.5 MG tablet TAKE 8 TABLETS BY MOUTH EVERY 7  DAYS 76 tablet 1  . mv-min-C-glutamin-lysine-hb124 (AIRBORNE, LYSINE HCL,) 1,000-50 mg PwPk Take by mouth       . risperiDONE (RISPERDAL) 0.5 MG tablet TAKE ONE TABLET BY MOUTH nightly as needed for ANXIETY 90 tablet 1  . upadacitinib  (RINVOQ ) 15 mg Tb24 Take 15 mg by mouth once daily    . etanercept (ENBREL SURECLICK) 50 mg/mL (0.98 mL) pen injector Inject 1 mL (50 mg total) subcutaneously once a week. 12 mL 1  . gabapentin (NEURONTIN) 300 MG capsule TAKE 1 CAPSULE BY MOUTH TWICE  DAILY 60 capsule 0  . levETIRAcetam  (KEPPRA ) 750 MG tablet TAKE 1 TABLET BY MOUTH  TWICE DAILY 180 tablet 3  . metoprolol  succinate (TOPROL -XL) 25 MG XL tablet Take 0.5 tablets (12.5 mg total) by mouth once daily 45 tablet 0  . ergocalciferol, vitamin D2, 1,250 mcg (50,000 unit) capsule  Take 1 capsule (50,000 Units total) by mouth once a week for 90 days 12 capsule 0   No facility-administered medications prior to visit.     Objective  Vitals:   01/18/23 1048  BP: 123/78  Pulse: 71  Weight: 89.8 kg (197 lb 15.6 oz)  PainSc:   4  PainLoc: Generalized   Body mass index is 35.07 kg/m. Home Vitals:     Physical Exam Constitutional: alert, in NAD, and communicates well Respiratory: clear to auscultation, without rales or wheezes  Cardiovascular: regular rate and rhythm and without murmurs, rubs or gallops Lower extremities: no lower extremity edema Vaginal exam deferred  Assessment/Plan:   Diagnoses and all  orders for this visit:  History of seizure disorder Tremor - stable per patient report and review of record.  Followed by specialist.  Continue follow-up as recommended. -     levETIRAcetam  (KEPPRA ) 750 MG tablet; Take 1 tablet (750 mg total) by mouth 2 (two) times daily -     levETIRAcetam  (KEPPRA ) 750 MG tablet; Take 1 tablet (750 mg total) by mouth every 12 (twelve) hours for 30 days  Benign essential HTN Palpitations - previously saw cardio - continue beta blocker -     Comprehensive Metabolic Panel (CMP); Future -     Lipid Panel W/Reflex Direct Low Density Lipoprotein (LDL) Cholesterol; Future -     Complete Blood Count (CBC); Future -     Hemoglobin A1C; Future -     metoprolol  succinate (TOPROL -XL) 25 MG XL tablet; Take 0.5 tablets (12.5 mg total) by mouth once daily  Rheumatoid arthritis, involving unspecified site, unspecified whether rheumatoid factor present (CMS/HHS-HCC) - stable per patient report and review of record.  Followed by specialist.  Continue follow-up as recommended.  Fibromyalgia GAD (generalized anxiety disorder) Recurrent major depressive disorder, in full remission (CMS-HCC) Insomnia, unspecified type -     gabapentin (NEURONTIN) 300 MG capsule; Take 1 capsule (300 mg total) by mouth 2 (two) times daily  Vaginal discharge -     Ambulatory Referral to Gynecology  Encounter for screening mammogram for malignant neoplasm of breast -     Mammo screening breast tomosynthesis bilateral; Future  Colon cancer screening -     [facility])  Other orders -     Follow up in Primary Care; Future     This visit was coded based on medical decision making (MDM).      Follow-up     Future Labs/Procedures Expected by Expires   Follow up in Primary Care [[id number] Custom]  01/18/2024 (Approximate) 07/19/2024   Questions:     Does this order need to be coordinated with another visit or should it be hidden from the patient portal? If yes to  either, the patient will need to stop by the front desk or call to schedule.: No   Who is this follow-up with?: PCP   What type of follow up is needed?: Physical   What's the reason for follow up?:          Future Appointments     Date/Time Provider Department Center Visit Type   01/31/2024 11:00 AM (Arrive by 10:45 AM) Vertell Rogue, DO [facility] PHYSICAL       There are no Patient Instructions on file for this visit.  An after visit summary was provided for the patient either in written format (printed) or through MyChart.

## 2023-11-13 NOTE — Progress Notes (Signed)
 Chief Complaint  Patient presents with  . Rheumatoid Arthritis    History of present illness:    Heather Salas is a 68 y.o.female who presents today for follow-up evaluation of active moderate Stage 2 rheumatoid arthritis with positive rheumatoid factor. She was last seen 01/25/2022 and was doing better on rinvoq  but not taking methotrexate consistently, but with ongoing fibromyalgia pains. We noted improvemen, and confirmed good RA control with Vectra panel.  She last got hand x-rays completed 2023. She last completed a DEXA scan 2010 with another ordered but not completed.   Heather Salas was diagnosed with rheumatoid arthritis with positive rheumatoid factor in her late 65s by an outside rheumatologist. Diagnosis was supported by positive rheumatoid factor, positive anti-CCP, erosions on imaging, rheumatoid nodules, sicca symptoms, and corneal melt. She came to [facility] in 2008. She was diagnosed with fibromyalgia in her early 60s.   Heather Salas has tried the following rheumatologic medications in the past: - Methotrexate - current - Humira - caused a reaction involving left arm swelling - Plaquenil - was taking in the past, cannot recall what happened but does not recall there being an issue with the medication.  - Enbrel - loss of efficacy - Cymbalta  - current - Gabapentin - current - Rinvoq  - current  She is doing poorly today. She has been off of rinvoq  for about two months, and is not sure when she ran out of methotrexate but recently discovered an unfinished bottle after her move. She notes she came off of her gabapentin as well, and is has stopped many medications due to loss of organization during her move. She is hurting all over, including simply to the touch, but also notes increased swelling and morning stiffness.   She has not had any other significant health changes or unexplained new symptoms since last visit.   Heather Salas's work status is on disability since the late  43s or early 73s. She is a former smoker who smokes 0.5 ppd for 2 years, for a total of 1 pack years. She quit smoking in 1979. She drinks an average of 0 servings of alcohol weekly. Heather Salas reports a family history of lupus in her mother, RA in her aunt, but denies any family history of known autoimmune disease otherwise.    Review of Systems ROS was negative except as noted above.  Patient Active Problem List   Diagnosis Date Noted  . Seizure (CMS/HHS-HCC) 02/13/2019  . History of depression 02/13/2019  . Anxiety, generalized 08/29/2018  . Tremor 07/10/2018  . Palpitations 10/30/2017  . Benign essential HTN 10/30/2017  . Fall with injury 06/26/2017  . RA (rheumatoid arthritis) (CMS/HHS-HCC) 06/26/2017  . Mixed hyperlipidemia 05/09/2016  . Pseudophakia of right eye 04/08/2014  . Cataract 04/08/2014  . S/P cataract surgery 12/11/2012  . Corneal perforation 05/16/2012  . Seizure disorder (CMS-HCC) 09/18/2011  . Rheumatoid arthritis(714.0) 09/18/2011  . Carpal tunnel syndrome, bilateral 09/18/2011  . Fatigue 09/18/2011  . Encounter for long-term (current) use of medications 09/18/2011  . Heart palpitations 09/18/2011  . Fibromyalgia 09/18/2011  . Reflux 09/18/2011    Past Medical History:  Diagnosis Date  . Abnormal liver function tests 09/18/2011  . Carpal tunnel syndrome, bilateral 09/18/2011  . Cataract 04/08/2014  . Corneal ulcer, perforated    OS 04-15-2012  . Depression   . Fatigue 09/18/2011  . Fibromyalgia 09/18/2011  . Heart palpitations 09/18/2011  . Reflux 09/18/2011  . Rheumatoid arthritis(714.0) (CMS/HHS-HCC) 09/18/2011  . Seizure disorder (CMS/HHS-HCC)  09/18/2011   Past Surgical History:  Procedure Laterality Date  . CHOLECYSTECTOMY  2000  . 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 REPAIR CORNEAL LACERATION Left 07/10/2012   Procedure: REPAIR CORNEAL LACERATION perforated corneal ulcer-failed intra-op glueing needs patch graft;  Surgeon: Eleanor Landry Matter, MD;  [facility];  Service: Ophthalmology;  Laterality: Left;  . lipoma    . uterine ablation     Social History   Socioeconomic History  . Marital status: Married  Tobacco Use  . Smoking status: Former    Current packs/day: 0.50    Average packs/day: 0.5 packs/day for 3.0 years (1.5 ttl pk-yrs)    Types: Cigarettes    Passive exposure: Past  . Smokeless tobacco: Never  Vaping Use  . Vaping status: Never Used  Substance and Sexual Activity  . Alcohol use: Yes    Alcohol/week: 0.0 standard drinks of alcohol    Comment: occasionally  . Drug use: No  . Sexual activity: Yes    Partners: Male    Birth control/protection: Post-menopausal   Family History  Problem Relation Name Age of Onset  . Diabetes type II Father    . Coronary Artery Disease (Blocked arteries around heart) Father    . COPD Father    . Congenital heart disease Mother         hole in heart  . Stroke Mother    . Lupus Mother    . Irregular Heart Beat (Arrhythmia) Mother    . Irregular Heart Beat (Arrhythmia) Maternal Grandmother    . Colon cancer Neg Hx    . Breast cancer Neg Hx      Physical Exam: BP 118/68   Temp 36.6 C (97.9 F) (Temporal)   Ht 160 cm (5' 3)   Wt 90.3 kg (199 lb)   BMI 35.25 kg/m   General: Pleasant elderly white woman sitting in chair. Well groomed, no acute distress. Non-toxic appearance.  HEENT: Conjunctivae normal.  Pulmonary: Normal effort of breathing. Symmetric chest expansion.  Musculoskeletal: Tenderness to palpation of PIPs, MCPs, wrists with S1+ synovitis, elbows, shoulders, knees. Fist formation unimpaired bilaterally. Grip strength appropriate and equal. Negative squeeze  tests to hands and feet. No other tender, synovitic, warm, erythematous, or deformed joints. FROM all joints except as above.  Skin: Skin warm and dry. No rashes appreciable. Neurologic: Oriented to time, person, place, and situation. Normal gait.  Psychological: Normal behavior, thought content, and judgment. Records were reviewed No results found for: RF, CCP Lab Results  Component Value Date/Time   SEDRATE 23 01/25/2022 11:48 AM   Lab Results  Component Value Date/Time   GLUCOSE 84 01/18/2023 11:26 AM   GLUCOSE 86 09/18/2011 01:23 PM   BUN 12 01/18/2023 11:26 AM   CREATININE 1.2 (H) 01/18/2023 11:26 AM   CREATININE 0.8 09/18/2011 01:23 PM   NA 139 01/18/2023 11:26 AM   NA 143 09/18/2011 01:23 PM   K 3.9 01/18/2023 11:26 AM   K 4.1 09/18/2011 01:23 PM   CL 105 01/18/2023 11:26 AM   CL 109 (H) 09/18/2011 01:23 PM   CO2 24 01/18/2023 11:26 AM   CALCIUM 8.6 (L) 01/18/2023 11:26 AM   CALCIUM  9.4 09/18/2011 01:23 PM   ALB 3.3 (L) 01/18/2023 11:26 AM   ALB 3.5 09/18/2011 01:23 PM   AGRATIO 1.3 03/06/2019 11:09 AM   ALKPHOS 92 01/18/2023 11:26 AM   ALKPHOS 84 09/18/2011 01:23 PM   AST 33 01/18/2023 11:26 AM   AST 24 09/18/2011 01:23 PM   ALT 27 01/18/2023 11:26 AM   ALT 19 09/18/2011 01:23 PM   Lab Results  Component Value Date/Time   WBC 7.5 11/20/2023 03:22 PM   WBC 5.7 09/22/2013 11:15 AM   WBC 2.6 (L) 05/28/2008 05:09 AM   RBC 3.99 (L) 11/20/2023 03:22 PM   RBC 4.23 09/22/2013 11:15 AM   RBC 4.03 05/28/2008 05:09 AM   HGB 11.9 (L) 11/20/2023 03:22 PM   HGB 11.9 (L) 05/28/2008 05:09 AM   HCT 37.2 11/20/2023 03:22 PM   HCT 0.39 09/22/2013 11:15 AM   HCT 0.36 05/28/2008 05:09 AM   MCV 93.2 11/20/2023 03:22 PM   MCV 92 09/22/2013 11:15 AM   MCH 29.8 11/20/2023 03:22 PM   MCH 29.8 09/22/2013 11:15 AM   MCHC 32.0 11/20/2023 03:22 PM   MCHC 32.2 09/22/2013 11:15 AM   PLT 354 11/20/2023 03:22 PM   PLT 238 09/22/2013 11:15 AM   PLT 104 (L) 05/28/2008 05:09 AM     Failed to redirect to the Timeline version of the REVFS SmartLink. CDAI is calculated as follows:  SDAI = SJC + TJC +PGA + EGA Where: 2 + 28 + 8 + 5 = 43  SJC = Swollen Joint Count TJC = Tender Joint Count PGA = Patient Global Assessment of Disease Activity EGA = Evaluator Global Assessment of Disease Activity  Interpretation <=2.8: Remission >2.8 and <=10: Low Disease Activity >10 and <=22: Moderate Disease Activity >22: High Disease Activity  Assessment and Plan: Rheumatoid arthritis involving multiple sites with positive rheumatoid factor (CMS/HHS-HCC)  (primary encounter diagnosis) Plan: CBC w/auto Differential (5 Part), Hepatic        Function Panel (HFP), Creatinine, Sedimentation       Rate-Automated, C-Reactive Protein, Quant -        [facility], Viral Hepatitis HBV, HCV - [facility],        QuantiFERON-TB Gold Plus - [facility],        methotrexate (RHEUMATREX) 2.5 MG tablet, folic        acid (FOLVITE) 1 MG tablet  Fibromyalgia  Encounter for long-term (current) use of high-risk medication   1. Rheumatoid arthritis-moderate Stage 2.  Poorly controlled.  Resume rinvoq  and methotrexate.   2. High risk prescription-This patient is on drug therapy requiring intensive monitoring for toxicity, including regular lab monitoring and screening for serious or recurrent infections.  Today, I assessed for side effects, infections, new or worsening health conditions that may be related to the medications, and will obtain labs.  Return in about 3 months (around 02/19/2024).     Medication List       * Accurate as of November 20, 2023  4:29 PM. If you have any questions, ask your nurse or doctor.          CONTINUE taking these medications    AIRBORNE (LYSINE HCL) 1,000-50 mg Pwpk Generic drug: mv-min-C-glutamin-lysine-hb124   clonazePAM  0.5 MG tablet Commonly known as: KlonoPIN  TAKE ONE TABLET BY MOUTH UP TO THREE TIMES DAILY as needed for ANXIETY OR TREMOR   DULoxetine   60 MG DR capsule Commonly known as: CYMBALTA  Take 1 capsule (60 mg total) by mouth once daily   ergocalciferol (vitamin  D2) 1,250 mcg (50,000 unit) capsule Take 1 capsule (50,000 Units total) by mouth once a week for 90 days   folic acid 1 MG tablet Commonly known as: FOLVITE Take 1 tablet (1,000 mcg total) by mouth once daily   gabapentin 300 MG capsule Commonly known as: NEURONTIN Take 1 capsule (300 mg total) by mouth 2 (two) times daily   * levETIRAcetam  750 MG tablet Commonly known as: KEPPRA  Take 1 tablet (750 mg total) by mouth every 12 (twelve) hours for 30 days   * levETIRAcetam  750 MG tablet Commonly known as: KEPPRA  TAKE 1 TABLET BY MOUTH TWICE  DAILY   melatonin 1 mg tablet   methotrexate 2.5 MG tablet Commonly known as: RHEUMATREX Take 8 tablets (20 mg total) by mouth every 7 (seven) days   metoprolol  SUCCinate 25 MG XL tablet Commonly known as: TOPROL -XL TAKE ONE-HALF TABLET BY MOUTH  ONCE DAILY   REFRESH OPHTH   RINVOQ  15 mg extended release tablet Generic drug: upadacitinib    risperiDONE 0.5 MG tablet Commonly known as: RisperDAL take 1 tablet by mouth nightly as needed for anxiety   rosuvastatin 20 MG tablet Commonly known as: CRESTOR Take 1 tablet (20 mg total) by mouth once daily      * * This list has 2 medication(s) that are the same as other medications prescribed for you. Read the directions carefully, and ask your doctor or other care provider to review them with you.            Where to Get Your Medications     These medications were sent to [facility] - [place], [place] - [address] [place] [address]  [address] [place], [place] [address]    Phone: [phone]  folic acid 1 MG tablet methotrexate 2.5 MG tablet    Orders Placed This Encounter  Procedures  . CBC w/auto Differential (5 Part)  . Hepatic Function Panel (HFP)  . Creatinine  . Sedimentation Rate-Automated  . C-Reactive  Protein, Quant - [facility]  . Viral Hepatitis HBV, HCV - [facility]  . QuantiFERON-TB Gold Plus - [facility]    All new prescription medications, changes in current prescription dosages, and sample medications were discussed with the patient, including patient education, medication name, use, dosage, potential side effects, drug interactions, consequences of not using/taking, and special instructions. Patient expressed understanding. No barriers to adherence. *Some images could not be shown.

## 2024-03-23 ENCOUNTER — Emergency Department

## 2024-03-23 ENCOUNTER — Other Ambulatory Visit: Payer: Self-pay

## 2024-03-23 ENCOUNTER — Observation Stay
Admission: EM | Admit: 2024-03-23 | Discharge: 2024-03-26 | Disposition: A | Discharge status: Home / Self Care | Attending: Obstetrics and Gynecology | Admitting: Obstetrics and Gynecology

## 2024-03-23 DIAGNOSIS — W08XXXA Fall from other furniture, initial encounter: Secondary | ICD-10-CM | POA: Insufficient documentation

## 2024-03-23 DIAGNOSIS — S52512A Displaced fracture of left radial styloid process, initial encounter for closed fracture: Principal | ICD-10-CM | POA: Insufficient documentation

## 2024-03-23 DIAGNOSIS — N1831 Chronic kidney disease, stage 3a: Secondary | ICD-10-CM | POA: Diagnosis present

## 2024-03-23 DIAGNOSIS — Y92009 Unspecified place in unspecified non-institutional (private) residence as the place of occurrence of the external cause: Secondary | ICD-10-CM

## 2024-03-23 DIAGNOSIS — R569 Unspecified convulsions: Secondary | ICD-10-CM | POA: Insufficient documentation

## 2024-03-23 DIAGNOSIS — I129 Hypertensive chronic kidney disease with stage 1 through stage 4 chronic kidney disease, or unspecified chronic kidney disease: Secondary | ICD-10-CM | POA: Diagnosis not present

## 2024-03-23 DIAGNOSIS — S22080A Wedge compression fracture of T11-T12 vertebra, initial encounter for closed fracture: Secondary | ICD-10-CM | POA: Diagnosis present

## 2024-03-23 DIAGNOSIS — R52 Pain, unspecified: Principal | ICD-10-CM

## 2024-03-23 DIAGNOSIS — S52502A Unspecified fracture of the lower end of left radius, initial encounter for closed fracture: Secondary | ICD-10-CM

## 2024-03-23 DIAGNOSIS — W19XXXA Unspecified fall, initial encounter: Secondary | ICD-10-CM

## 2024-03-23 DIAGNOSIS — E785 Hyperlipidemia, unspecified: Secondary | ICD-10-CM | POA: Diagnosis present

## 2024-03-23 DIAGNOSIS — M069 Rheumatoid arthritis, unspecified: Secondary | ICD-10-CM | POA: Diagnosis present

## 2024-03-23 DIAGNOSIS — Z87891 Personal history of nicotine dependence: Secondary | ICD-10-CM | POA: Insufficient documentation

## 2024-03-23 DIAGNOSIS — F418 Other specified anxiety disorders: Secondary | ICD-10-CM | POA: Diagnosis not present

## 2024-03-23 DIAGNOSIS — S62102A Fracture of unspecified carpal bone, left wrist, initial encounter for closed fracture: Secondary | ICD-10-CM | POA: Diagnosis present

## 2024-03-23 DIAGNOSIS — F1090 Alcohol use, unspecified, uncomplicated: Secondary | ICD-10-CM | POA: Insufficient documentation

## 2024-03-23 DIAGNOSIS — M797 Fibromyalgia: Secondary | ICD-10-CM | POA: Diagnosis present

## 2024-03-23 DIAGNOSIS — S22088A Other fracture of T11-T12 vertebra, initial encounter for closed fracture: Secondary | ICD-10-CM | POA: Diagnosis not present

## 2024-03-23 DIAGNOSIS — M25532 Pain in left wrist: Secondary | ICD-10-CM | POA: Diagnosis present

## 2024-03-23 DIAGNOSIS — I10 Essential (primary) hypertension: Secondary | ICD-10-CM | POA: Diagnosis present

## 2024-03-23 HISTORY — DX: Essential (primary) hypertension: I10

## 2024-03-23 HISTORY — DX: Hyperlipidemia, unspecified: E78.5

## 2024-03-23 LAB — BASIC METABOLIC PANEL WITH GFR
Anion gap: 9 (ref 5–15)
BUN: 15 mg/dL (ref 8–23)
CO2: 25 mmol/L (ref 22–32)
Calcium: 9.4 mg/dL (ref 8.9–10.3)
Chloride: 105 mmol/L (ref 98–111)
Creatinine, Ser: 1.11 mg/dL — ABNORMAL HIGH (ref 0.44–1.00)
GFR, Estimated: 55 mL/min — ABNORMAL LOW (ref >60)
Glucose, Bld: 113 mg/dL — ABNORMAL HIGH (ref 70–99)
Potassium: 4.1 mmol/L (ref 3.5–5.1)
Sodium: 139 mmol/L (ref 135–145)

## 2024-03-23 LAB — CBC
HCT: 38.8 % (ref 36.0–46.0)
Hemoglobin: 12.6 g/dL (ref 12.0–15.0)
MCH: 29.9 pg (ref 26.0–34.0)
MCHC: 32.5 g/dL (ref 30.0–36.0)
MCV: 92.2 fL (ref 80.0–100.0)
Platelets: 249 K/uL (ref 150–400)
RBC: 4.21 MIL/uL (ref 3.87–5.11)
RDW: 14.3 % (ref 11.5–15.5)
WBC: 9.1 K/uL (ref 4.0–10.5)
nRBC: 0 % (ref 0.0–0.2)

## 2024-03-23 LAB — PROTIME-INR
INR: 1 (ref 0.8–1.2)
Prothrombin Time: 14.2 s (ref 11.4–15.2)

## 2024-03-23 LAB — APTT: aPTT: 27 s (ref 24–36)

## 2024-03-23 MED ORDER — LIDOCAINE 5 % EX PTCH
1.0000 | MEDICATED_PATCH | CUTANEOUS | Status: DC
  Administered 2024-03-23 – 2024-03-25 (×3): 1 via TRANSDERMAL
  Filled 2024-03-23 (×2): qty 1

## 2024-03-23 MED ORDER — ACETAMINOPHEN 325 MG PO TABS: 650.0000 mg | ORAL_TABLET | Freq: Four times a day (QID) | ORAL | Status: DC | PRN

## 2024-03-23 MED ORDER — MIDAZOLAM HCL (PF) 10 MG/2ML IJ SOLN: 2.0000 mg | INTRAMUSCULAR | Status: DC | PRN

## 2024-03-23 MED ORDER — OXYCODONE HCL 5 MG PO TABS
5.0000 mg | ORAL_TABLET | Freq: Once | ORAL | Status: AC
  Administered 2024-03-23: 5 mg via ORAL
  Filled 2024-03-23: qty 1

## 2024-03-23 MED ORDER — CLONAZEPAM 0.5 MG PO TABS: 0.5000 mg | ORAL_TABLET | Freq: Two times a day (BID) | ORAL | Status: DC | PRN

## 2024-03-23 MED ORDER — ONDANSETRON HCL 4 MG/2ML IJ SOLN
4.0000 mg | Freq: Three times a day (TID) | INTRAMUSCULAR | Status: DC | PRN
  Administered 2024-03-24: 4 mg via INTRAVENOUS
  Filled 2024-03-23 (×2): qty 2

## 2024-03-23 MED ORDER — MORPHINE SULFATE (PF) 2 MG/ML IV SOLN
2.0000 mg | INTRAVENOUS | Status: DC | PRN
  Administered 2024-03-24: 2 mg via INTRAVENOUS
  Filled 2024-03-23: qty 1

## 2024-03-23 MED ORDER — METHOCARBAMOL 500 MG PO TABS
500.0000 mg | ORAL_TABLET | Freq: Three times a day (TID) | ORAL | Status: DC | PRN
  Administered 2024-03-24 – 2024-03-25 (×3): 500 mg via ORAL
  Filled 2024-03-23 (×3): qty 1

## 2024-03-23 MED ORDER — LEVETIRACETAM 750 MG PO TABS
750.0000 mg | ORAL_TABLET | Freq: Two times a day (BID) | ORAL | Status: DC
  Administered 2024-03-23 – 2024-03-26 (×6): 750 mg via ORAL
  Filled 2024-03-23 (×6): qty 1

## 2024-03-23 MED ORDER — LIDOCAINE HCL (PF) 1 % IJ SOLN
5.0000 mL | Freq: Once | INTRAMUSCULAR | Status: AC
  Administered 2024-03-23: 5 mL
  Filled 2024-03-23: qty 5

## 2024-03-23 MED ORDER — OXYCODONE-ACETAMINOPHEN 5-325 MG PO TABS
1.0000 | ORAL_TABLET | ORAL | Status: DC | PRN
  Administered 2024-03-24 – 2024-03-26 (×7): 1 via ORAL
  Filled 2024-03-23 (×7): qty 1

## 2024-03-23 NOTE — ED Provider Notes (Cosign Needed Addendum)
 [facility] Emergency Department Provider Note     Event Date/Time   First MD Initiated Contact with Patient 03/23/24 1542     (approximate)   History   Fall   HPI  Heather Salas is a 68 y.o. female with a history of fibromyalgia, rheumatoid arthritis, seizures presents to the ED following a fall at home.  Patient reports she was trying to get a furniture mover device for her neighbors and went to pick it up and fell backwards onto a stretched out head and injuring her left wrist.  Also complains of lower back pain.  Denies loss of bladder or bowel control.  Denies saddle anesthesia.  She did not hit her head or lose consciousness.  Denies anticoagulation use.  Endorses decreased movement to left wrist.  Reports some numbness but sensation remains intact.     Physical Exam   Triage Vital Signs: ED Triage Vitals [03/23/24 1427]  Encounter Vitals Group     BP (!) 142/78     Girls Systolic BP Percentile      Girls Diastolic BP Percentile      Boys Systolic BP Percentile      Boys Diastolic BP Percentile      Pulse Rate 72     Resp 20     Temp 98.7 F (37.1 C)     Temp Source Oral     SpO2 96 %     Weight      Height      Head Circumference      Peak Flow      Pain Score      Pain Loc      Pain Education      Exclude from Growth Chart     Most recent vital signs: Vitals:   03/23/24 1427 03/23/24 1618  BP: (!) 142/78 138/78  Pulse: 72 70  Resp: 20 18  Temp: 98.7 F (37.1 C) 98.3 F (36.8 C)  SpO2: 96% 98%    General: Alert and oriented. INAD.  Head:  NCAT.  Eyes:  PERRLA. EOMI.  Nose:   Mucosa is moist. No rhinorrhea. Neck:   No cervical spine tenderness to palpation.  CV:  Good peripheral perfusion. RRR.  RESP:  Normal effort. LCTAB.  BACK:  Midline tenderness to lumbar region.  Guarding noted. NEURO: Cranial nerves II-XII intact. No focal deficits. Speech is clear. Sensation and motor function intact.  Equal muscle strength of  UE & LE bilaterally.  OTHER:  Left wrist reveals dorsal edema.  Ecchymosis along radial aspect into thumb.  Limited range of motion of all digits due to severe arthritis, however secondary to pain of injury.  Radial pulses palpated 2 +.  Neurovascular status intact.  Good capillary refill.     ED Results / Procedures / Treatments   Labs (all labs ordered are listed, but only abnormal results are displayed) Labs Reviewed  CBC  BASIC METABOLIC PANEL WITH GFR    RADIOLOGY  I personally viewed and evaluated these images as part of my medical decision making, as well as reviewing the written report by the radiologist.  CT Head Wo Contrast Result Date: 03/23/2024 CLINICAL DATA:  fall EXAM: CT HEAD WITHOUT CONTRAST CT CERVICAL SPINE WITHOUT CONTRAST TECHNIQUE: Multidetector CT imaging of the head and cervical spine was performed following the standard protocol without intravenous contrast. Multiplanar CT image reconstructions of the cervical spine were also generated. RADIATION DOSE REDUCTION: This exam was performed according to  the departmental dose-optimization program which includes automated exposure control, adjustment of the mA and/or kV according to patient size and/or use of iterative reconstruction technique. COMPARISON:  None Available. FINDINGS: CT HEAD FINDINGS Brain: No evidence of acute infarction, hemorrhage, hydrocephalus, extra-axial collection or mass lesion/mass effect. Patchy white matter hypodensities are nonspecific but compatible with chronic microvascular ischemic disease. Vascular: No hyperdense vessel. Skull: No acute fracture. Sinuses/Orbits: Clear sinuses.  No acute orbital findings. Other: No mastoid effusions. CT CERVICAL SPINE FINDINGS Alignment: Straightening.  No substantial sagittal subluxation. Skull base and vertebrae: No acute fracture. Soft tissues and spinal canal: No prevertebral fluid or swelling. No visible canal hematoma. Disc levels: Mild multilevel degenerative  change. Upper chest: Lung apices are clear. IMPRESSION: No evidence of acute abnormality intracranially or in the cervical spine. Electronically Signed   By: Gilmore GORMAN Molt M.D.   On: 03/23/2024 17:04   CT Cervical Spine Wo Contrast Result Date: 03/23/2024 CLINICAL DATA:  fall EXAM: CT HEAD WITHOUT CONTRAST CT CERVICAL SPINE WITHOUT CONTRAST TECHNIQUE: Multidetector CT imaging of the head and cervical spine was performed following the standard protocol without intravenous contrast. Multiplanar CT image reconstructions of the cervical spine were also generated. RADIATION DOSE REDUCTION: This exam was performed according to the departmental dose-optimization program which includes automated exposure control, adjustment of the mA and/or kV according to patient size and/or use of iterative reconstruction technique. COMPARISON:  None Available. FINDINGS: CT HEAD FINDINGS Brain: No evidence of acute infarction, hemorrhage, hydrocephalus, extra-axial collection or mass lesion/mass effect. Patchy white matter hypodensities are nonspecific but compatible with chronic microvascular ischemic disease. Vascular: No hyperdense vessel. Skull: No acute fracture. Sinuses/Orbits: Clear sinuses.  No acute orbital findings. Other: No mastoid effusions. CT CERVICAL SPINE FINDINGS Alignment: Straightening.  No substantial sagittal subluxation. Skull base and vertebrae: No acute fracture. Soft tissues and spinal canal: No prevertebral fluid or swelling. No visible canal hematoma. Disc levels: Mild multilevel degenerative change. Upper chest: Lung apices are clear. IMPRESSION: No evidence of acute abnormality intracranially or in the cervical spine. Electronically Signed   By: Gilmore GORMAN Molt M.D.   On: 03/23/2024 17:04   CT LUMBAR SPINE WO CONTRAST Result Date: 03/23/2024 EXAM: CT OF THE LUMBAR SPINE WITHOUT CONTRAST 03/23/2024 03:20:00 PM TECHNIQUE: CT of the lumbar spine was performed without the administration of intravenous  contrast. Multiplanar reformatted images are provided for review. Automated exposure control, iterative reconstruction, and/or weight based adjustment of the mA/kV was utilized to reduce the radiation dose to as low as reasonably achievable. COMPARISON: None available. CLINICAL HISTORY: Pt comes via EMS from home with c/o fall. Pt had fall today and now having lower back pain and left wrist pain. Pt does have deformity. Pt denies any hitting head. 50 mcg fent to right arm. 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 Hyperplastic ribs at T12. Grade 1 anterolisthesis at L4-5 due to facet arthrosis. T12 wedge compression fracture with 15% height loss and no retropulsion. DEGENERATIVE CHANGES:\ No bony spinal canal stenosis. SOFT TISSUES: Calcific aortic atherosclerosis. No acute abnormality. IMPRESSION: 1. T12 wedge compression fracture with 15% height loss and no retropulsion. 2. Grade 1 anterolisthesis at L4-5 due to facet arthrosis. Electronically signed by: Franky Stanford MD 03/23/2024 05:00 PM EDT RP Workstation: [id number]   DG Wrist Complete Left Result Date: 03/23/2024 EXAM: 3 or more VIEW(S) XRAY OF THE LEFT WRIST 03/23/2024 02:51:00 PM COMPARISON: None available. CLINICAL HISTORY: Fall. Pt comes via EMS from home  with c/o fall. Pt had fall today and now having lower back pain and left wrist pain. Pt does have deformity. FINDINGS: BONES AND JOINTS: Comminuted and displaced distal radial metaphyseal fracture with apex volar angulation. There is involvement of the radiocarpal and distal radioulnar joints. Suspect nondisplaced ulnar styloid fracture. Carpal bones are intact. SOFT TISSUES: Soft tissue edema is seen at the fracture site. IMPRESSION: 1. Comminuted and displaced distal radial metaphyseal fracture with apex volar angulation, involving the radiocarpal and distal radioulnar joints. 2. Suspected nondisplaced ulnar styloid  fracture. Electronically signed by: Andrea Gasman MD 03/23/2024 03:48 PM EDT RP Workstation: [id number]    PROCEDURES:  Critical Care performed: No  .Reduction of fracture  Date/Time: 03/23/2024 8:28 PM  Performed by: Margrette Rebbeca LABOR, PA-C Authorized by: Margrette Rebbeca LABOR, PA-C  Consent: Verbal consent obtained Risks and benefits: risks, benefits and alternatives were discussed Consent given by: patient Patient understanding: patient states understanding of the procedure being performed Patient identity confirmed: verbally with patient Local anesthesia used: yes Anesthesia: hematoma block  Anesthesia: Local anesthesia used: yes Local Anesthetic: lidocaine  1% without epinephrine  Anesthetic total: 5 mL Patient tolerance: patient tolerated the procedure well with no immediate complications   .Ortho Injury Treatment  Date/Time: 03/23/2024 8:30 PM  Performed by: Margrette Rebbeca A, PA-C Authorized by: Margrette Rebbeca LABOR, PA-C   Consent:    Consent obtained:  Verbal   Consent given by:  Patient   Risks discussed:  Fracture, nerve damage, restricted joint movement and stiffness   Alternatives discussed:  No treatmentInjury location: wrist Location details: left wrist Injury type: fracture Fracture type: distal radius and ulnar styloid Pre-procedure neurovascular assessment: neurovascularly intact Pre-procedure distal perfusion: normal Pre-procedure neurological function: normal Pre-procedure range of motion: reduced  Anesthesia: Local anesthesia used: yes Local Anesthetic: lidocaine  1% without epinephrine   Patient sedated: NoManipulation performed: yes X-ray confirmed reduction: yes Immobilization: splint Splint type: sugar tong Splint Applied by: ED Tech Supplies used: cotton padding, elastic bandage and Ortho-Glass Post-procedure distal perfusion: normal Post-procedure neurological function: normal Post-procedure range of motion: unchanged      MEDICATIONS  ORDERED IN ED: Medications  oxyCODONE  (Oxy IR/ROXICODONE ) immediate release tablet 5 mg (5 mg Oral Given 03/23/24 1552)  lidocaine  (PF) (XYLOCAINE ) 1 % injection 5 mL (5 mLs Infiltration Given by Other 03/23/24 1757)  oxyCODONE  (Oxy IR/ROXICODONE ) immediate release tablet 5 mg (5 mg Oral Given 03/23/24 1916)     IMPRESSION / MDM / ASSESSMENT AND PLAN / ED COURSE  I reviewed the triage vital signs and the nursing notes.                              Clinical Course as of 03/23/24 1918  Sun Mar 23, 2024  1842 CT Head Wo Contrast IMPRESSION: No evidence of acute abnormality intracranially or in the cervical spine.   [MH]  1842 CT Cervical Spine Wo Contrast IMPRESSION: No evidence of acute abnormality intracranially or in the cervical spine   [MH]  1843 CT LUMBAR SPINE WO CONTRAST IMPRESSION: 1. T12 wedge compression fracture with 15% height loss and no retropulsion. 2. Grade 1 anterolisthesis at L4-5 due to facet arthrosis.   [MH]  1843 DG Wrist Complete Left [MH]  1843 IMPRESSION: 1. Comminuted and displaced distal radial metaphyseal fracture with apex volar angulation, involving the radiocarpal and distal radioulnar joints. 2. Suspected nondisplaced ulnar styloid fracture.   [MH]    Clinical Course User Index [MH] Heather Salas  A, PA-C    69 y.o. female presents to the emergency department for evaluation and treatment of fall. See HPI for further details.   Differential diagnosis includes, but is not limited to ICH, fracture, ligamentous injury, sprain, strain, radiculopathy  Patient's presentation is most consistent with acute complicated illness / injury requiring diagnostic workup.  Patient is alert and oriented.  She is hemodynamically stable.  Physical exam findings are as stated above.  Pertinent for obvious deformity to the left wrist with swelling and ecchymosis.  Limited range of motion.  CT head and cervical spine are reassuring.  CT lumbar spine reveals  compression fracture with 15% height loss at T12.  Patient placed in TLSO brace, advised to follow-up with neurosurgery for further management.   Left wrist reveals a comminuted and displaced distal radial metaphyseal fracture with apex volar angulation.  Wrist reduction was performed and we are pending postreduction x-rays.  Advised to follow-up with orthopedic for further management and possible surgical intervention.  After reassessment I do believe patient will benefit from admission for pain control due to injuries and PT/OT.  Patient reports she lives at home with her geriatric husband.  Will consult with hospitalist for possible admission.  Transferring care to [facility], PA-C who will receive call from hospitalist team to discuss admission.  FINAL CLINICAL IMPRESSION(S) / ED DIAGNOSES   Final diagnoses:  Fall, initial encounter  Closed fracture of distal end of left radius, unspecified fracture morphology, initial encounter  Compression fracture of T12 vertebra, initial encounter (HCC)   Rx / DC Orders   ED Discharge Orders     None        Note:  This document was prepared using Dragon voice recognition software and may include unintentional dictation errors.    Margrette, Katisha Shimizu A, PA-C 03/23/24 1934    Arlander [place], MD 03/23/24 1943    Margrette, Jonica Bickhart A, PA-C 03/23/24 2033    Arlander [place], MD 03/25/24 [phone]

## 2024-03-23 NOTE — ED Triage Notes (Signed)
 Pt comes via EMS from home with c/o fall. Pt had fall today and now having lower back pain and left wrist pain . Pt does have deformity.  Pt denies any hitting head or  50 mcg fent to right arm.

## 2024-03-23 NOTE — Progress Notes (Signed)
 Orthopedic Tech Progress Note Patient Details:  Heather Salas 01/01/1956 [id number]  Patient ID: Heather Salas, female   DOB: 1955-12-23, 68 y.o.   MRN: [id number] TLSO ordered from [facility]. Heather Salas 03/23/2024, 5:25 PM

## 2024-03-23 NOTE — ED Provider Notes (Signed)
  Physical Exam  BP 138/78   Pulse 70   Temp 98.3 F (36.8 C)   Resp 18   SpO2 98%   Physical Exam  Procedures  Procedures  ED Course / MDM   Clinical Course as of 03/23/24 2002  Sun Mar 23, 2024  1842 CT Head Wo Contrast IMPRESSION: No evidence of acute abnormality intracranially or in the cervical spine.   [MH]  1842 CT Cervical Spine Wo Contrast IMPRESSION: No evidence of acute abnormality intracranially or in the cervical spine   [MH]  1843 CT LUMBAR SPINE WO CONTRAST IMPRESSION: 1. T12 wedge compression fracture with 15% height loss and no retropulsion. 2. Grade 1 anterolisthesis at L4-5 due to facet arthrosis.   [MH]  1843 DG Wrist Complete Left [MH]  1843 IMPRESSION: 1. Comminuted and displaced distal radial metaphyseal fracture with apex volar angulation, involving the radiocarpal and distal radioulnar joints. 2. Suspected nondisplaced ulnar styloid fracture.   [MH]    Clinical Course User Index [MH] Margrette Rebbeca LABOR, PA-C   Medical Decision Making Amount and/or Complexity of Data Reviewed Labs: ordered. Radiology: ordered. Decision-making details documented in ED Course.  Risk Prescription drug management. Decision regarding hospitalization.  Handoff taken from [facility].  Labs ordered, CBC reassuring, BMP pending.  Spoke to hospitalist, Dr. Xilin Niu, on the phone who will come evaluate the patient for admission for pain control and possible inpatient PT/OT.  Will also likely need inpatient versus outpatient orthopedic follow-up depending on length of stay.     Sheron Salm, PA-C 03/23/24 SCARLETT Arlander [place], MD 03/23/24 2003

## 2024-03-23 NOTE — H&P (Signed)
 History and Physical    Heather Salas FMW:[id number] DOB: 13-Jan-1956 DOA: 03/23/2024  Referring MD/NP/PA:   PCP: Ashley Boby LABOR, MD   Patient coming from:  The patient is coming from home.     Chief Complaint: fall, left wrist pain and lower back pain.   HPI: Heather Salas is a 68 y.o. female with medical history significant of seizure, tremor, HTN, palpitation, HLD,  rheumatoid arthritis, fibromyalgia, depression with anxiety, CKD-3a who presents with fall, left wrist pain and lower back pain.   Pt states that she accidentally fell when she was trying to get a furniture mover device for her neighbors from closet around noon. She fell backward, injured her left wrist and lower back, causing pain in left wrist and lower back.  No loss of consciousness.  The pain is constant, sharp, severe, not radiating, aggravated by movement.  No leg numbness or weakness.  Denies head or neck injury.  No loss control of bladder or bowel movement. Patient has nausea, no vomiting, diarrhea or abdominal pain.  No symptoms of UTI.  No chest pain, cough, SOB.   Data reviewed independently and ED Course: pt was found to have WBC 9.1, stable renal function, temperature normal, blood pressure 138/78, heart rate 72, RR 20, oxygen saturation 98% on room air.  CT of head and CT of C-spine negative for acute injury.  Patient is placed in MedSurg bed for observation. Consulted Dr. Lorelle of ortho  X-ray of left wrist:  1. Comminuted and displaced distal radial metaphyseal fracture with apex volar angulation, involving the radiocarpal and distal radioulnar joints. 2. Suspected nondisplaced ulnar styloid fracture.  CT-L spin:  1. T12 wedge compression fracture with 15% height loss and no retropulsion. 2. Grade 1 anterolisthesis at L4-5 due to facet arthrosis.    EKG: Not done in ED, will get one.   Review of Systems:   General: no fevers, chills, no body weight gain, has fatigue HEENT: no blurry vision,  hearing changes or sore throat Respiratory: no dyspnea, coughing, wheezing CV: no chest pain, no palpitations GI: no nausea, vomiting, abdominal pain, diarrhea, constipation GU: no dysuria, burning on urination, increased urinary frequency, hematuria  Ext: no leg edema Neuro: no unilateral weakness, numbness, or tingling, no vision change or hearing loss. Has fall.  Has chronic hand tremor Skin: no rash, no skin tear. MSK: has left wrist pain and lower back pain Heme: No easy bruising.  Travel history: No recent long distant travel.   Allergy:  Allergies  Allergen Reactions   Celecoxib Nausea Only   Dilantin [Phenytoin] Hives    Past Medical History:  Diagnosis Date   Fibromyalgia    HLD (hyperlipidemia)    HTN (hypertension)    Motion sickness    car sick   Palpitations    Rheumatoid arthritis (HCC)    Seizures (HCC)    possible seizure 05/2017.  last seizure several years before that    Past Surgical History:  Procedure Laterality Date   ABLATION     uterine. over 15 yrs ago   CHOLECYSTECTOMY     ETHMOIDECTOMY Bilateral 02/21/2018   Procedure: BILATERAL TOTAL ETHMOIDECTOMY WITH FRONTAL SINUS EXPLORATION;  Surgeon: Edda Mt, MD;  Location: [facility];  Service: ENT;  Laterality: Bilateral;   IMAGE GUIDED SINUS SURGERY N/A 02/21/2018   Procedure: IMAGE GUIDED SINUS SURGERY gave CeCe disk on 02/15/18 DS;  Surgeon: Juengel, Paul, MD;  Location: [facility];  Service: ENT;  Laterality: N/A;  MAXILLARY ANTROSTOMY Left 02/21/2018   Procedure: LEFT MAXILLARY ANTROSTOMY WITH REMOVAL OF TISSUE FROM SINUS;  Surgeon: Edda Mt, MD;  Location: [facility];  Service: ENT;  Laterality: Left;   NASAL SINUS SURGERY  06/16/2010    Social History:  reports that she has quit smoking. She has never used smokeless tobacco. She reports current alcohol use. She reports that she does not use drugs.  Family History:  Family History  Problem Relation Age of  Onset   Lupus Mother    COPD Father      Prior to Admission medications   Medication Sig Start Date End Date Taking? Authorizing Provider  DULoxetine  (CYMBALTA ) 30 MG capsule Take 90 mg by mouth daily.    [provider]  etanercept (ENBREL) 50 MG/ML injection Inject 50 mg into the skin once a week.    [provider]  gabapentin (NEURONTIN) 100 MG capsule Take 100 mg by mouth 3 (three) times daily.    [provider]  levETIRAcetam  (KEPPRA ) 750 MG tablet Take 750 mg by mouth 2 (two) times daily.    [provider]  methotrexate (RHEUMATREX) 2.5 MG tablet Take 20 mg by mouth once a week. Caution:Chemotherapy. Protect from light.    [provider]  metoprolol  tartrate (LOPRESSOR ) 25 MG tablet Take 25 mg by mouth daily.    [provider]  Multiple Vitamins-Minerals (AIRBORNE PO) Take by mouth daily.    [provider]  sertraline (ZOLOFT) 50 MG tablet Take 50 mg by mouth daily.    [provider]  triamcinolone (NASACORT ALLERGY 24HR) 55 MCG/ACT AERO nasal inhaler Place 2 sprays into the nose daily.    [provider]  Teresa Gardner Oil (ARTIFICIAL TEARS) ointment as needed.    [provider]    Physical Exam: Vitals:   03/23/24 1427 03/23/24 1618 03/23/24 2253 03/23/24 2302  BP: (!) 142/78 138/78 129/77 129/77  Pulse: 72 70 82 82  Resp: 20 18 20 20   Temp: 98.7 F (37.1 C) 98.3 F (36.8 C) 98.8 F (37.1 C) 98.8 F (37.1 C)  TempSrc: Oral  Oral Oral  SpO2: 96% 98% 94%   Weight:    77.1 kg  Height:    5' 3 (1.6 m)   General: Not in acute distress HEENT:       Eyes: PERRL, EOMI, no jaundice       ENT: No discharge from the ears and nose, no pharynx injection, no tonsillar enlargement.        Neck: No JVD, no bruit, no mass felt. Heme: No neck lymph node enlargement. Cardiac: S1/S2, RRR, No murmurs, No gallops or rubs. Respiratory: No rales, wheezing, rhonchi or rubs. GI:  Soft, nondistended, nontender, no rebound pain, no organomegaly, BS present. GU: No hematuria Ext: No pitting leg edema bilaterally. 1+DP/PT pulse bilaterally. Musculoskeletal: Has left wrist tenderness (s/p of sling), lower back tenderness (s/p of TOSL) Skin: No rashes.  Neuro: Alert, oriented X3, cranial nerves II-XII grossly intact, moves all extremities. Has hand tremor  Psych: Patient is not psychotic, no suicidal or hemocidal ideation.  Labs on Admission: I have personally reviewed following labs and imaging studies  CBC: Recent Labs  Lab 03/23/24 1922  WBC 9.1  HGB 12.6  HCT 38.8  MCV 92.2  PLT 249   Basic Metabolic Panel: Recent Labs  Lab 03/23/24 1922  NA 139  K 4.1  CL 105  CO2 25  GLUCOSE 113*  BUN 15  CREATININE 1.11*  CALCIUM 9.4   GFR: Estimated Creatinine Clearance: 49.7 mL/min (A) (by C-G formula based on SCr of 1.11 mg/dL (H)). Liver Function Tests: No results for input(s): AST, ALT, ALKPHOS, BILITOT, PROT, ALBUMIN in the last 168 hours. No results for input(s): LIPASE, AMYLASE in the last 168 hours. No results for input(s): AMMONIA in the last 168 hours. Coagulation Profile: Recent Labs  Lab 03/23/24 2131  INR 1.0   Cardiac Enzymes: No results for input(s): CKTOTAL, CKMB, CKMBINDEX, TROPONINI in the last 168 hours. BNP (last 3 results) No results for input(s): PROBNP in the last 8760 hours. HbA1C: No results for input(s): HGBA1C in the last 72 hours. CBG: No results for input(s): GLUCAP in the last 168 hours. Lipid Profile: No results for input(s): CHOL, HDL, LDLCALC, TRIG, CHOLHDL, LDLDIRECT in the last 72 hours. Thyroid Function Tests: No results for input(s): TSH, T4TOTAL, FREET4, T3FREE, THYROIDAB in the last 72 hours. Anemia Panel: No results for input(s): VITAMINB12, FOLATE, FERRITIN, TIBC, IRON, RETICCTPCT in the last 72 hours. Urine analysis: No results found for:  COLORURINE, APPEARANCEUR, LABSPEC, PHURINE, GLUCOSEU, HGBUR, BILIRUBINUR, KETONESUR, PROTEINUR, UROBILINOGEN, NITRITE, LEUKOCYTESUR Sepsis Labs: @LABRCNTIP (procalcitonin:4,lacticidven:4) )No results found for this or any previous visit (from the past 240 hours).   Radiological Exams on Admission:   Assessment/Plan Principal Problem:   Left wrist fracture Active Problems:   T12 compression fracture (HCC)   Fall at home, initial encounter   HTN (hypertension)   HLD (hyperlipidemia)   Chronic kidney disease, stage 3a (HCC)   Rheumatoid arthritis (HCC)   Seizures (HCC)   Depression with anxiety   Assessment and Plan:   Left wrist fracture: X-ray showed comminuted and displaced distal radial metaphyseal fracture with apex volar angulation, involving the radiocarpal and distal radioulnar joints and suspected nondisplaced ulnar styloid fracture. Consulted Dr. Lorelle of ortho.  -place in med-surg bed for obs (if pt need surgery --> will need to change to inpt status) - As needed morphine , Percocet, Tylenol  - apply should sling - N.p.o. after midnight in case patient needed surgery  T12 compression fracture [facility]): CT showed T12 wedge compression fracture with 15% height loss and no retropulsion.  Patient does not want to surgery for this issue.  I did not consult neurosurgeon. -TLSO  -Pain control as above - As needed Robaxin  - Lidoderm  patch  Fall at home, initial encounter - Fall precaution - PT/OT -TOC consult for home health need  HTN (hypertension) -IV hydralazine as needed - Metoprolol   HLD (hyperlipidemia): Patient's not taking Crestor currently - Follow-up with PCP  Chronic kidney disease, stage 3a (HCC): Renal function close to baseline.  Recent baseline creatinine 1.1 on 11/20/2023.  Her creatinine 1.11, BUN 15, GFR 55. -Follow up with BMP  Rheumatoid arthritis (HCC): per his rheumatologist clinic note on 11/20/23 as below, patient is on  Rinvoq  and methotrexate, but patient is not sure which med she is taking, will need to clarify with her rheumatologist - Pending clarification Per rheumatology PE, Elsie Schaffer Defoor's note on 11/20/23  -Methotrexate - current - Humira - caused a reaction involving left arm swelling - Plaquenil - was taking in the past, cannot recall what happened but does not recall there being an issue with the medication.  - Enbrel - loss of efficacy - Cymbalta  - current - Gabapentin - current - Rinvoq  - curren  Seizures (HCC) -Seizure precaution - As needed Versed  for seizure - Continue Keppra  750 mg twice daily  Depression with anxiety - Cymbalta   DVT ppx: SCD  Code Status: Full code  Family Communication:     not done, no family member is at bed side.     Disposition Plan:  Anticipate discharge back to previous environment  Consults called:  Consulted Dr. Lorelle of ortho  Admission status and Level of care: Med-Surg:    for obs     Dispo: The patient is from: Home              Anticipated d/c is to: Home              Anticipated d/c date is: 1 day              Patient currently is not medically stable to d/c.    Severity of Illness:  The appropriate patient status for this patient is OBSERVATION. Observation status is judged to be reasonable and necessary in order to provide the required intensity of service to ensure the patient's safety. The patient's presenting symptoms, physical exam findings, and initial radiographic and laboratory data in the context of their medical condition is felt to place them at decreased risk for further clinical deterioration. Furthermore, it is anticipated that the patient will be medically stable for discharge from the hospital within 2 midnights of admission.        Date of Service 03/24/2024    Caleb Exon [facility]   If 7PM-7AM, please contact night-coverage [email or web address] 03/24/2024, 1:33 AM

## 2024-03-23 NOTE — ED Notes (Signed)
 Call for a TLSO Brace at 5:17 pm and spoke to [place]. Thanks

## 2024-03-24 DIAGNOSIS — S52502A Unspecified fracture of the lower end of left radius, initial encounter for closed fracture: Secondary | ICD-10-CM

## 2024-03-24 DIAGNOSIS — S62102D Fracture of unspecified carpal bone, left wrist, subsequent encounter for fracture with routine healing: Secondary | ICD-10-CM

## 2024-03-24 LAB — CBC
HCT: 36.5 % (ref 36.0–46.0)
Hemoglobin: 11.8 g/dL — ABNORMAL LOW (ref 12.0–15.0)
MCH: 29.6 pg (ref 26.0–34.0)
MCHC: 32.3 g/dL (ref 30.0–36.0)
MCV: 91.5 fL (ref 80.0–100.0)
Platelets: 233 K/uL (ref 150–400)
RBC: 3.99 MIL/uL (ref 3.87–5.11)
RDW: 14.4 % (ref 11.5–15.5)
WBC: 5.9 K/uL (ref 4.0–10.5)
nRBC: 0 % (ref 0.0–0.2)

## 2024-03-24 LAB — HIV ANTIBODY (ROUTINE TESTING W REFLEX): HIV Screen 4th Generation wRfx: NONREACTIVE

## 2024-03-24 MED ORDER — UPADACITINIB ER 15 MG PO TB24: 15.0000 mg | ORAL_TABLET | Freq: Every day | ORAL | Status: DC

## 2024-03-24 MED ORDER — METOPROLOL TARTRATE 25 MG PO TABS
25.0000 mg | ORAL_TABLET | Freq: Every day | ORAL | Status: DC
  Administered 2024-03-24 – 2024-03-26 (×3): 25 mg via ORAL
  Filled 2024-03-24 (×3): qty 1

## 2024-03-24 MED ORDER — ENOXAPARIN SODIUM 40 MG/0.4ML IJ SOSY
40.0000 mg | PREFILLED_SYRINGE | Freq: Every day | INTRAMUSCULAR | Status: DC
  Administered 2024-03-24 – 2024-03-25 (×2): 40 mg via SUBCUTANEOUS
  Filled 2024-03-24 (×2): qty 0.4

## 2024-03-24 MED ORDER — DULOXETINE HCL 30 MG PO CPEP
90.0000 mg | ORAL_CAPSULE | Freq: Every day | ORAL | Status: DC
  Administered 2024-03-24 – 2024-03-26 (×3): 90 mg via ORAL
  Filled 2024-03-24 (×3): qty 3

## 2024-03-24 NOTE — Evaluation (Signed)
 Occupational Therapy Evaluation Patient Details Name: Heather Salas MRN: [id number] DOB: 1956-06-22 Today's Date: 03/24/2024   History of Present Illness   68 y/o female presented to ED on 03/23/24 after fall with L wrist pain and lower back pain. Sustained comminuted and displaced L radial fracture and T12 wedge compression fx with 15% height loss. PMH: seizure, tremor, HTN, RA, fibromyalgia, depression with anxiety, CKD 3a     Clinical Impressions Pt was seen for OT evaluation this date. PTA, patient lives with husband and was independent with no AD and reporting 1 fall in past 6 months which is why she is here. Pt presents with deficits in strength, ROM, balance, activity tolerance and pain management, affecting safe and optimal ADL completion. Pt currently requires Min A for bed mobility via log roll technique for trunkal assist d/t NWB status of LUE. She required CGA for STS from EOB and ambulation within the room to the door and back with HHA x1 to none with CGA for safety. Pt required Max A for LB dressing management and TLSO donning. Educated on back precautions and use of long handled equipment or modification to LB dressing tasks to adhere to back precautions. Also edu on NWB status of LUE. Pt will need assist with TLSO and LB ADL management at home, but reports her husband will likely not be physically able to assist with these tasks. Pt would benefit from skilled OT services to address noted impairments and functional limitations to maximize safety and independence while minimizing future risk of falls, injury, and readmission. Do anticipate the need for follow up OT services upon acute hospital DC.      If plan is discharge home, recommend the following:   A little help with walking and/or transfers;A lot of help with bathing/dressing/bathroom;Assistance with cooking/housework     Functional Status Assessment   Patient has had a recent decline in their functional status and  demonstrates the ability to make significant improvements in function in a reasonable and predictable amount of time.     Equipment Recommendations   Other (comment) (defer to next venue)     Recommendations for Other Services         Precautions/Restrictions   Precautions Precautions: Fall;Back Precaution Booklet Issued: No Recall of Precautions/Restrictions: Intact Required Braces or Orthoses: Spinal Brace Spinal Brace: Thoracolumbosacral orthotic;Applied in sitting position Restrictions Weight Bearing Restrictions Per Provider Order: Yes LUE Weight Bearing Per Provider Order: Non weight bearing     Mobility Bed Mobility Overal bed mobility: Needs Assistance Bed Mobility: Rolling, Sidelying to Sit Rolling: Min assist Sidelying to sit: Min assist       General bed mobility comments: trunkal assist d/t LUE NWB    Transfers Overall transfer level: Needs assistance Equipment used: 1 person hand held assist Transfers: Sit to/from Stand Sit to Stand: Contact guard assist           General transfer comment: stand from EOB at lowest height, ambulated in room with CGA via HHA x1 progressing to none      Balance Overall balance assessment: Needs assistance Sitting-balance support: No upper extremity supported, Feet supported Sitting balance-Leahy Scale: Good     Standing balance support: No upper extremity supported, During functional activity Standing balance-Leahy Scale: Fair                             ADL either performed or assessed with clinical judgement   ADL Overall ADL's :  Needs assistance/impaired                 Upper Body Dressing : Total assistance;Maximal assistance Upper Body Dressing Details (indicate cue type and reason): to donn TLSO at EOB Lower Body Dressing: Maximal assistance;Total assistance Lower Body Dressing Details (indicate cue type and reason): to adjust bil socks Toilet Transfer: Contact guard  assist Toilet Transfer Details (indicate cue type and reason): simulated to recliner with HHA x1         Functional mobility during ADLs: Contact guard assist       Vision         Perception         Praxis         Pertinent Vitals/Pain Pain Assessment Pain Assessment: Faces Faces Pain Scale: Hurts even more Pain Location: back and L wrist Pain Descriptors / Indicators: Discomfort, Grimacing, Guarding Pain Intervention(s): Limited activity within patient's tolerance, Monitored during session, Repositioned     Extremity/Trunk Assessment Upper Extremity Assessment Upper Extremity Assessment: Generalized weakness;LUE deficits/detail LUE Deficits / Details: distal radius fx NWB in splint   Lower Extremity Assessment Lower Extremity Assessment: Generalized weakness   Cervical / Trunk Assessment Cervical / Trunk Assessment: Other exceptions Cervical / Trunk Exceptions: T12 compression fx   Communication Communication Communication: No apparent difficulties   Cognition Arousal: Alert Behavior During Therapy: WFL for tasks assessed/performed                                 Following commands: Intact       Cueing  General Comments      splint intact   Exercises Other Exercises Other Exercises: Edu on role of OT in acute setting, back precautions, increased assist for TLSO and LB ADL management.   Shoulder Instructions      Home Living                                          Prior Functioning/Environment                      OT Problem List: Decreased strength;Decreased activity tolerance;Decreased range of motion;Pain;Impaired UE functional use   OT Treatment/Interventions: Self-care/ADL training;Therapeutic exercise;Therapeutic activities;Energy conservation;DME and/or AE instruction;Patient/family education;Balance training      OT Goals(Current goals can be found in the care plan section)   Acute Rehab  OT Goals Patient Stated Goal: improve function OT Goal Formulation: With patient Time For Goal Achievement: 04/07/24 Potential to Achieve Goals: Good ADL Goals Pt Will Perform Lower Body Bathing: with contact guard assist;with adaptive equipment;sitting/lateral leans;sit to/from stand Pt Will Perform Lower Body Dressing: with contact guard assist;sitting/lateral leans;sit to/from stand;with adaptive equipment Pt Will Transfer to Toilet: with modified independence;ambulating;regular height toilet   OT Frequency:  Min 2X/week    Co-evaluation              AM-PAC OT 6 Clicks Daily Activity     Outcome Measure Help from another person eating meals?: None Help from another person taking care of personal grooming?: A Little Help from another person toileting, which includes using toliet, bedpan, or urinal?: A Little Help from another person bathing (including washing, rinsing, drying)?: A Lot Help from another person to put on and taking off regular upper body clothing?: A Little Help from another person  to put on and taking off regular lower body clothing?: A Lot 6 Click Score: 17   End of Session Equipment Utilized During Treatment: Gait belt Nurse Communication: Mobility status  Activity Tolerance: Patient tolerated treatment well Patient left: in chair;with call bell/phone within reach;with chair alarm set  OT Visit Diagnosis: Other abnormalities of gait and mobility (R26.89);Muscle weakness (generalized) (M62.81);Pain Pain - Right/Left: Left Pain - part of body: Arm                Time: 8962-8946 OT Time Calculation (min): 16 min Charges:  OT General Charges $OT Visit: 1 Visit OT Evaluation $OT Eval Low Complexity: 1 Low Kaho Selle, OTR/L 03/24/24, 5:04 PM  Jasmynn Pfalzgraf E Arionna Hoggard 03/24/2024, 5:01 PM

## 2024-03-24 NOTE — Plan of Care (Signed)

## 2024-03-24 NOTE — Evaluation (Signed)
 Physical Therapy Evaluation Patient Details Name: Heather Salas MRN: [id number] DOB: 1956/02/08 Today's Date: 03/24/2024  History of Present Illness  68 y/o female presented to ED on 03/23/24 after fall with L wrist pain and lower back pain. Sustained comminuted and displaced L radial fracture and T12 wedge compression fx with 15% height loss. PMH: seizure, tremor, HTN, RA, fibromyalgia, depression with anxiety, CKD 3a  Clinical Impression  Patient admitted with the above. PTA, patient lives with husband and was independent with no AD and reporting 1 fall in past 6 months which is why she is here. Patient presents with weakness, impaired balance, and decreased activity tolerance. Educated patient on TLSO brace and how to don/doff as well as weightbearing precautions for L UE, patient verbalized understanding but likely will need reinforcement and assistance at home. Required minA for log roll technique for bed mobility and sit to stand from EOB with HHAx1. Ambulated within room (~25') with no AD and CGA (patient deferred further mobility). Unsure if husband is able to assist physically, however patient reports he uses 2 canes but does all cleaning in the house. Patient will benefit from skilled PT services during acute stay to address listed deficits. Patient will benefit from ongoing therapy at discharge to maximize functional independence and safety.        If plan is discharge home, recommend the following: A little help with walking and/or transfers;A little help with bathing/dressing/bathroom;Assistance with cooking/housework;Assist for transportation;Help with stairs or ramp for entrance   Can travel by private vehicle        Equipment Recommendations Cane  Recommendations for Other Services       Functional Status Assessment Patient has had a recent decline in their functional status and demonstrates the ability to make significant improvements in function in a reasonable and predictable  amount of time.     Precautions / Restrictions Precautions Precautions: Fall;Back Precaution Booklet Issued: No Recall of Precautions/Restrictions: Intact Required Braces or Orthoses: Spinal Brace Spinal Brace: Thoracolumbosacral orthotic;Applied in sitting position Restrictions Weight Bearing Restrictions Per Provider Order: No      Mobility  Bed Mobility Overal bed mobility: Needs Assistance Bed Mobility: Rolling, Sidelying to Sit Rolling: Min assist Sidelying to sit: Min assist            Transfers Overall transfer level: Needs assistance Equipment used: 1 person hand held assist Transfers: Sit to/from Stand Sit to Stand: Contact guard assist                Ambulation/Gait Ambulation/Gait assistance: Contact guard assist Gait Distance (Feet): 25 Feet Assistive device: 1 person hand held assist, None Gait Pattern/deviations: Step-through pattern, Decreased stride length Gait velocity: decreased     General Gait Details: CGA for safety, patient deferred further mobility due to not wanting to go into hallway  Stairs            Wheelchair Mobility     Tilt Bed    Modified Rankin (Stroke Patients Only)       Balance Overall balance assessment: Needs assistance Sitting-balance support: No upper extremity supported, Feet supported Sitting balance-Leahy Scale: Good     Standing balance support: No upper extremity supported, During functional activity Standing balance-Leahy Scale: Fair                               Pertinent Vitals/Pain Pain Assessment Pain Assessment: Faces Faces Pain Scale: Hurts even more Pain Location: back  and L wrist Pain Descriptors / Indicators: Discomfort, Grimacing, Guarding Pain Intervention(s): Limited activity within patient's tolerance, Monitored during session, Repositioned    Home Living Family/patient expects to be discharged to:: Private residence Living Arrangements: Spouse/significant  other Available Help at Discharge: Family Type of Home: Apartment Home Access: Level entry       Home Layout: One level Home Equipment: Agricultural consultant (2 wheels);Cane - single point Additional Comments: unsure if husband is able to help physically, however she reports he does all of the cleaning    Prior Function Prior Level of Function : Independent/Modified Independent;History of Falls (last six months)             Mobility Comments: reports 1 fall in past 6 months       Extremity/Trunk Assessment   Upper Extremity Assessment Upper Extremity Assessment: Defer to OT evaluation    Lower Extremity Assessment Lower Extremity Assessment: Generalized weakness    Cervical / Trunk Assessment Cervical / Trunk Assessment: Other exceptions Cervical / Trunk Exceptions: T12 compression fx  Communication   Communication Communication: No apparent difficulties    Cognition Arousal: Alert Behavior During Therapy: WFL for tasks assessed/performed   PT - Cognitive impairments: No apparent impairments                         Following commands: Intact       Cueing       General Comments      Exercises     Assessment/Plan    PT Assessment Patient needs continued PT services  PT Problem List Decreased strength;Decreased activity tolerance;Decreased balance;Decreased mobility;Decreased knowledge of use of DME;Decreased safety awareness;Decreased knowledge of precautions       PT Treatment Interventions Gait training;DME instruction;Functional mobility training;Therapeutic exercise;Therapeutic activities;Balance training;Neuromuscular re-education;Patient/family education    PT Goals (Current goals can be found in the Care Plan section)  Acute Rehab PT Goals Patient Stated Goal: to go home PT Goal Formulation: With patient Time For Goal Achievement: 04/07/24 Potential to Achieve Goals: Fair    Frequency Min 2X/week     Co-evaluation                AM-PAC PT 6 Clicks Mobility  Outcome Measure Help needed turning from your back to your side while in a flat bed without using bedrails?: A Little Help needed moving from lying on your back to sitting on the side of a flat bed without using bedrails?: A Little Help needed moving to and from a bed to a chair (including a wheelchair)?: A Little Help needed standing up from a chair using your arms (e.g., wheelchair or bedside chair)?: A Little Help needed to walk in hospital room?: A Little Help needed climbing 3-5 steps with a railing? : A Little 6 Click Score: 18    End of Session Equipment Utilized During Treatment: Back brace Activity Tolerance: Patient tolerated treatment well Patient left: in chair;with call bell/phone within reach Nurse Communication: Mobility status PT Visit Diagnosis: Unsteadiness on feet (R26.81);Muscle weakness (generalized) (M62.81);Difficulty in walking, not elsewhere classified (R26.2)    Time: 8963-8942 PT Time Calculation (min) (ACUTE ONLY): 21 min   Charges:   PT Evaluation $PT Eval Moderate Complexity: 1 Mod   PT General Charges $$ ACUTE PT VISIT: 1 Visit         Maryanne Finder, PT, DPT Physical Therapist - [facility]  [facility]   Adreona Brand A Jazlene Bares 03/24/2024, 1:14 PM

## 2024-03-24 NOTE — Consult Note (Signed)
 ORTHOPAEDIC CONSULTATION  REQUESTING PHYSICIAN: Jhonny Calvin NOVAK, MD  Chief Complaint:   Left distal radius fracture  History of Present Illness: Heather Salas is a 68 y.o. female with medical history significant of seizure, tremor, HTN, palpitation, HLD,  rheumatoid arthritis, fibromyalgia, depression with anxiety, CKD-3a who presented after a mechanical fall injuring her left wrist and her lower back.  She was trying to get a furniture moving device out of her closet and fell backwards landing on her left wrist and lower back.  She did not lose consciousness or have any pre-existing or presyncopal episode.  She reports she is ambidextrous but favors her left hand for certain activities.  Denies any pre-existing pain in her wrist or hand.  Denies any numbness at this time that she is able to move her fingers.  Past Medical History:  Diagnosis Date   Fibromyalgia    HLD (hyperlipidemia)    HTN (hypertension)    Motion sickness    car sick   Palpitations    Rheumatoid arthritis (HCC)    Seizures (HCC)    possible seizure 05/2017.  last seizure several years before that   Past Surgical History:  Procedure Laterality Date   ABLATION     uterine. over 15 yrs ago   CHOLECYSTECTOMY     ETHMOIDECTOMY Bilateral 02/21/2018   Procedure: BILATERAL TOTAL ETHMOIDECTOMY WITH FRONTAL SINUS EXPLORATION;  Surgeon: Edda Mt, MD;  Location: [facility];  Service: ENT;  Laterality: Bilateral;   IMAGE GUIDED SINUS SURGERY N/A 02/21/2018   Procedure: IMAGE GUIDED SINUS SURGERY gave CeCe disk on 02/15/18 DS;  Surgeon: Juengel, Paul, MD;  Location: [facility];  Service: ENT;  Laterality: N/A;   MAXILLARY ANTROSTOMY Left 02/21/2018   Procedure: LEFT MAXILLARY ANTROSTOMY WITH REMOVAL OF TISSUE FROM SINUS;  Surgeon: Edda Mt, MD;  Location: [facility];  Service: ENT;  Laterality: Left;   NASAL SINUS SURGERY   06/16/2010   Social History   Socioeconomic History   Marital status: Married    Spouse name: Not on file   Number of children: Not on file   Years of education: Not on file   Highest education level: Not on file  Occupational History   Not on file  Tobacco Use   Smoking status: Former   Smokeless tobacco: Never   Tobacco comments:    quit over 40 ys ago  Vaping Use   Vaping status: Never Used  Substance and Sexual Activity   Alcohol use: Yes    Comment: rare- Holidays   Drug use: Never   Sexual activity: Not on file  Other Topics Concern   Not on file  Social History Narrative   Not on file   Social Drivers of Health   Financial Resource Strain: Not on file  Food Insecurity: No Food Insecurity (03/23/2024)   Hunger Vital Sign    Worried About Running Out of Food in the Last Year: Never true    Ran Out of Food in the Last Year: Never true  Transportation Needs: No Transportation Needs (03/23/2024)   PRAPARE - Administrator, Civil Service (Medical): No    Lack of Transportation (Non-Medical): No  Physical Activity: Not on file  Stress: Not on file  Social Connections: Moderately Integrated (03/23/2024)   Social Connection and Isolation Panel    Frequency of Communication with Friends and Family: Once a week    Frequency of Social Gatherings with Friends and Family: Once a week  Attends Religious Services: More than 4 times per year    Active Member of Clubs or Organizations: No    Attends Banker Meetings: 1 to 4 times per year    Marital Status: Married   Family History  Problem Relation Age of Onset   Lupus Mother    COPD Father    Allergies  Allergen Reactions   Celecoxib Nausea Only   Dilantin [Phenytoin] Hives   Prior to Admission medications   Medication Sig Start Date End Date Taking? Authorizing Provider  DULoxetine  (CYMBALTA ) 30 MG capsule Take 90 mg by mouth daily.   Yes [provider]  levETIRAcetam  (KEPPRA )  750 MG tablet Take 750 mg by mouth 2 (two) times daily.   Yes [provider]  methotrexate (RHEUMATREX) 2.5 MG tablet Take 20 mg by mouth once a week. Caution:Chemotherapy. Protect from light.   Yes [provider]  metoprolol  tartrate (LOPRESSOR ) 25 MG tablet Take 25 mg by mouth daily.   Yes [provider]  Multiple Vitamins-Minerals (AIRBORNE PO) Take by mouth daily.   Yes [provider]  triamcinolone (NASACORT ALLERGY 24HR) 55 MCG/ACT AERO nasal inhaler Place 2 sprays into the nose daily.   Yes [provider]  Vitamin D, Ergocalciferol, (DRISDOL) 1.25 MG (50000 UNIT) CAPS capsule Take 50,000 Units by mouth every 7 (seven) days. 10/13/21  Yes [provider]  etanercept (ENBREL) 50 MG/ML injection Inject 50 mg into the skin once a week. Patient not taking: Reported on 03/23/2024    [provider]  gabapentin (NEURONTIN) 100 MG capsule Take 100 mg by mouth 3 (three) times daily. Patient not taking: Reported on 03/23/2024    [provider]  metoprolol  succinate (TOPROL -XL) 25 MG 24 hr tablet Take 12.5 mg by mouth daily.    [provider]  sertraline (ZOLOFT) 50 MG tablet Take 50 mg by mouth daily. Patient not taking: Reported on 03/23/2024    [provider]  Upadacitinib  ER (RINVOQ ) 15 MG TB24 Take 15 mg by mouth daily at 12 noon.    [provider]  Teresa Gardner Oil (ARTIFICIAL TEARS) ointment as needed. Patient not taking: Reported on 03/23/2024    [provider]   DG Wrist Complete Left Result Date: 03/23/2024 CLINICAL DATA:  Status post reduction EXAM: LEFT WRIST - COMPLETE 3+ VIEW COMPARISON:  Film from earlier in the same day. FINDINGS: Previously seen distal radial fracture has been reduced. Some mild posterior angulation remains although the fracture fragments are in better alignment. No other focal abnormality is noted. IMPRESSION: Status post reduction  Electronically Signed   By: Oneil Devonshire M.D.   On: 03/23/2024 19:35   CT Head Wo Contrast Result Date: 03/23/2024 CLINICAL DATA:  fall EXAM: CT HEAD WITHOUT CONTRAST CT CERVICAL SPINE WITHOUT CONTRAST TECHNIQUE: Multidetector CT imaging of the head and cervical spine was performed following the standard protocol without intravenous contrast. Multiplanar CT image reconstructions of the cervical spine were also generated. RADIATION DOSE REDUCTION: This exam was performed according to the departmental dose-optimization program which includes automated exposure control, adjustment of the mA and/or kV according to patient size and/or use of iterative reconstruction technique. COMPARISON:  None Available. FINDINGS: CT HEAD FINDINGS Brain: No evidence of acute infarction, hemorrhage, hydrocephalus, extra-axial collection or mass lesion/mass effect. Patchy white matter hypodensities are nonspecific but compatible with chronic microvascular ischemic disease. Vascular: No hyperdense vessel. Skull: No acute fracture. Sinuses/Orbits: Clear sinuses.  No acute orbital findings. Other: No mastoid  effusions. CT CERVICAL SPINE FINDINGS Alignment: Straightening.  No substantial sagittal subluxation. Skull base and vertebrae: No acute fracture. Soft tissues and spinal canal: No prevertebral fluid or swelling. No visible canal hematoma. Disc levels: Mild multilevel degenerative change. Upper chest: Lung apices are clear. IMPRESSION: No evidence of acute abnormality intracranially or in the cervical spine. Electronically Signed   By: Gilmore GORMAN Molt M.D.   On: 03/23/2024 17:04   CT Cervical Spine Wo Contrast Result Date: 03/23/2024 CLINICAL DATA:  fall EXAM: CT HEAD WITHOUT CONTRAST CT CERVICAL SPINE WITHOUT CONTRAST TECHNIQUE: Multidetector CT imaging of the head and cervical spine was performed following the standard protocol without intravenous contrast. Multiplanar CT image reconstructions of the cervical spine were also  generated. RADIATION DOSE REDUCTION: This exam was performed according to the departmental dose-optimization program which includes automated exposure control, adjustment of the mA and/or kV according to patient size and/or use of iterative reconstruction technique. COMPARISON:  None Available. FINDINGS: CT HEAD FINDINGS Brain: No evidence of acute infarction, hemorrhage, hydrocephalus, extra-axial collection or mass lesion/mass effect. Patchy white matter hypodensities are nonspecific but compatible with chronic microvascular ischemic disease. Vascular: No hyperdense vessel. Skull: No acute fracture. Sinuses/Orbits: Clear sinuses.  No acute orbital findings. Other: No mastoid effusions. CT CERVICAL SPINE FINDINGS Alignment: Straightening.  No substantial sagittal subluxation. Skull base and vertebrae: No acute fracture. Soft tissues and spinal canal: No prevertebral fluid or swelling. No visible canal hematoma. Disc levels: Mild multilevel degenerative change. Upper chest: Lung apices are clear. IMPRESSION: No evidence of acute abnormality intracranially or in the cervical spine. Electronically Signed   By: Gilmore GORMAN Molt M.D.   On: 03/23/2024 17:04   CT LUMBAR SPINE WO CONTRAST Result Date: 03/23/2024 EXAM: CT OF THE LUMBAR SPINE WITHOUT CONTRAST 03/23/2024 03:20:00 PM TECHNIQUE: CT of the lumbar spine was performed without the administration of intravenous contrast. Multiplanar reformatted images are provided for review. Automated exposure control, iterative reconstruction, and/or weight based adjustment of the mA/kV was utilized to reduce the radiation dose to as low as reasonably achievable. COMPARISON: None available. CLINICAL HISTORY: Pt comes via EMS from home with c/o fall. Pt had fall today and now having lower back pain and left wrist pain. Pt does have deformity. Pt denies any hitting head. 50 mcg fent to right arm. FINDINGS: BONES AND ALIGNMENT: Transitional anatomy with partial sacralization of  L5 with a right-sided assimilation joint. Hyperplastic ribs at T12. Grade 1 anterolisthesis at L4-5 due to facet arthrosis. T12 wedge compression fracture with 15% height loss and no retropulsion. DEGENERATIVE CHANGES:\ No bony spinal canal stenosis. SOFT TISSUES: Calcific aortic atherosclerosis. No acute abnormality. IMPRESSION: 1. T12 wedge compression fracture with 15% height loss and no retropulsion. 2. Grade 1 anterolisthesis at L4-5 due to facet arthrosis. Electronically signed by: Franky Stanford MD 03/23/2024 05:00 PM EDT RP Workstation: [id number]   DG Wrist Complete Left Result Date: 03/23/2024 EXAM: 3 or more VIEW(S) XRAY OF THE LEFT WRIST 03/23/2024 02:51:00 PM COMPARISON: None available. CLINICAL HISTORY: Fall. Pt comes via EMS from home with c/o fall. Pt had fall today and now having lower back pain and left wrist pain. Pt does have deformity. FINDINGS: BONES AND JOINTS: Comminuted and displaced distal radial metaphyseal fracture with apex volar angulation. There is involvement of the radiocarpal and distal radioulnar joints. Suspect nondisplaced ulnar styloid fracture. Carpal bones are intact. SOFT TISSUES: Soft tissue edema is seen at the fracture site. IMPRESSION: 1. Comminuted and displaced distal radial metaphyseal fracture with apex volar angulation,  involving the radiocarpal and distal radioulnar joints. 2. Suspected nondisplaced ulnar styloid fracture. Electronically signed by: Andrea Gasman MD 03/23/2024 03:48 PM EDT RP Workstation: [id number]    Positive ROS: All other systems have been reviewed and were otherwise negative with the exception of those mentioned in the HPI and as above.  Physical Exam: General:  Alert, no acute distress Psychiatric:  Patient is competent for consent with normal mood and affect   Cardiovascular:  No pedal edema Respiratory:  No wheezing, non-labored breathing GI:  Abdomen is soft and non-tender Skin:  No lesions in the area of chief  complaint Neurologic:  Sensation intact distally Lymphatic:  No axillary or cervical lymphadenopathy  Orthopedic Exam:  Left upper extremity Sugar-tong splint in place Tender to palpation along the radial aspect of the wrist Able to wiggle all fingers with brisk capillary fill to the fingertips No tenderness over the elbow shoulder or proximal arm  Secondary survey No tenderness to palpation over other bony prominences in the lower extremities or bilateral upper extremities No pain with logroll or simulated axial loading of the bilateral lower extremity All compartments soft No tenderness to palpation over the cervical spine, no bony step-off TLSO brace in place limiting lower back exam Motor grossly intact throughout, no focal deficits Sensation grossly intact throughout, no focal deficits Good distal pulses and capillary refill on all extremities   X-rays:  X-rays reviewed which show a comminuted intra-articular left distal radius fracture.  There is good reduction with reasonable alignment on AP and lateral images after emergency room reduction.  Agree with radiology interpretation.   Assessment: Left distal radius fracture  Plan: I reviewed the clinical and radiographic images with the patient.  She is a comminuted intra-articular left distal radius fracture with reasonable alignment after emergency room reduction.  I reviewed the images with my upper extremity partners who agree that the patient might benefit from nonoperative treatment should the reduction hold.  I discussed with the patient doing repeat imaging in 5 to 7 days to assess the alignment of the fracture and make a decision on potential surgical intervention based on the results of that x-ray.  All questions answered patient agrees to the above plan follow-up outpatient.  We did discuss the risk with fracture displacement of neurovascular injury, need for additional reduction, need for surgical intervention, risks of  compartment syndrome and the signs and symptoms to watch out for.  I recommended the patient keep the wrist elevated above her heart to reduce swelling and use a sling and the splint for protection.    Arthea Sheer MD  Beeper #:  [phone]  03/24/2024 12:05 PM

## 2024-03-24 NOTE — Progress Notes (Signed)
 PROGRESS NOTE    Danielys Madry Bekele  FMW:[id number] DOB: 1956/01/27 DOA: 03/23/2024 PCP: Ashley Boby LABOR, MD    Brief Narrative:  68 y.o. female with medical history significant of seizure, tremor, HTN, palpitation, HLD,  rheumatoid arthritis, fibromyalgia, depression with anxiety, CKD-3a who presents with fall, left wrist pain and lower back pain.    Pt states that she accidentally fell when she was trying to get a furniture mover device for her neighbors from closet around noon. She fell backward, injured her left wrist and lower back, causing pain in left wrist and lower back.  No loss of consciousness.  The pain is constant, sharp, severe, not radiating, aggravated by movement.  No leg numbness or weakness.  Denies head or neck injury.  No loss control of bladder or bowel movement. Patient has nausea, no vomiting, diarrhea or abdominal pain.  No symptoms of UTI.  No chest pain, cough, SOB.   Assessment & Plan:   Principal Problem:   Left wrist fracture Active Problems:   T12 compression fracture (HCC)   Fall at home, initial encounter   HTN (hypertension)   HLD (hyperlipidemia)   Chronic kidney disease, stage 3a (HCC)   Rheumatoid arthritis (HCC)   Seizures (HCC)   Depression with anxiety   Closed fracture of left distal radius  Left wrist fracture:  X-ray showed comminuted and displaced distal radial metaphyseal fracture with apex volar angulation, involving the radiocarpal and distal radioulnar joints and suspected nondisplaced ulnar styloid fracture. Consulted Dr. Lorelle of ortho.  Reduced by EDP in ER Plan: Nonoperative management Pain control May need skilled nursing facility placement Therapy evaluations TOC consult  T12 compression fracture [facility]): CT showed T12 wedge compression fracture with 15% height loss and no retropulsion.  Patient does not want to surgery for this issue.  I did not consult neurosurgeon. Plan: TLSO Multimodal pain control Skilled nursing  facility referral   Fall at home, initial encounter Fall precautions Therapy evaluation   HTN (hypertension) PTA metoprolol  IV hydralazine as needed   HLD (hyperlipidemia):  Patient's not taking Crestor currently Outpatient follow-up   Chronic kidney disease, stage 3a (HCC):  Renal function close to baseline.  Recent baseline creatinine 1.1 on 11/20/2023.  Her creatinine 1.11, BUN 15, GFR 55. Creatinine at baseline   Rheumatoid arthritis [facility]): per his rheumatologist clinic note on 11/20/23 as below, patient is on Rinvoq  and methotrexate, but patient is not sure which med she is taking, will need to clarify with her rheumatologist - Pending clarification Per rheumatology PE, Elsie Schaffer Defoor's note on 11/20/23  -Methotrexate - current - Humira - caused a reaction involving left arm swelling - Plaquenil - was taking in the past, cannot recall what happened but does not recall there being an issue with the medication.  - Enbrel - loss of efficacy - Cymbalta  - current - Gabapentin - current - Rinvoq  - curren   Seizures (HCC) -Seizure precaution - As needed Versed  for seizure - Continue Keppra  750 mg twice daily   Depression with anxiety - Continue Cymbalta    DVT prophylaxis: SQ Lovenox  Code Status: Full Family Communication: Husband via phone 8/25 Disposition Plan: Status is: Observation The patient will require care spanning > 2 midnights and should be moved to inpatient because: Intractable pain.  Ambulatory dysfunction.  Will need skilled nursing facility.   Level of care: Med-Surg  Consultants:  Orthopedics  Procedures:  None  Antimicrobials: None   Subjective: Seen and examined.  Resting in bed.  Reports  some left upper extremity pain and lower back pain  Objective: Vitals:   03/23/24 2302 03/24/24 0447 03/24/24 0739 03/24/24 1447  BP: 129/77 (!) 168/81 (!) 145/93 (!) 146/72  Pulse: 82 91 88 72  Resp: 20 18 18 16   Temp: 98.8 F (37.1 C) 98.3 F  (36.8 C) 98.3 F (36.8 C) 98.4 F (36.9 C)  TempSrc: Oral Oral Oral Oral  SpO2:  98% 96% 96%  Weight: 77.1 kg     Height: 5' 3 (1.6 m)      No intake or output data in the 24 hours ending 03/24/24 1459 Filed Weights   03/23/24 2302  Weight: 77.1 kg    Examination:  General exam: NAD Respiratory system: Clear to auscultation. Respiratory effort normal. Cardiovascular system: S1 S2, RRR, no murmurs, no pedal edema Gastrointestinal system: Soft, NT/ND, normal bowel sounds Central nervous system: Alert and oriented. No focal neurological deficits. Extremities: Low back pain.  Left wrist in splint Skin: No rashes, lesions or ulcers Psychiatry: Judgement and insight appear normal. Mood & affect appropriate.     Data Reviewed: I have personally reviewed following labs and imaging studies  CBC: Recent Labs  Lab 03/23/24 1922 03/24/24 0439  WBC 9.1 5.9  HGB 12.6 11.8*  HCT 38.8 36.5  MCV 92.2 91.5  PLT 249 233   Basic Metabolic Panel: Recent Labs  Lab 03/23/24 1922  NA 139  K 4.1  CL 105  CO2 25  GLUCOSE 113*  BUN 15  CREATININE 1.11*  CALCIUM 9.4   GFR: Estimated Creatinine Clearance: 49.7 mL/min (A) (by C-G formula based on SCr of 1.11 mg/dL (H)). Liver Function Tests: No results for input(s): AST, ALT, ALKPHOS, BILITOT, PROT, ALBUMIN in the last 168 hours. No results for input(s): LIPASE, AMYLASE in the last 168 hours. No results for input(s): AMMONIA in the last 168 hours. Coagulation Profile: Recent Labs  Lab 03/23/24 2131  INR 1.0   Cardiac Enzymes: No results for input(s): CKTOTAL, CKMB, CKMBINDEX, TROPONINI in the last 168 hours. BNP (last 3 results) No results for input(s): PROBNP in the last 8760 hours. HbA1C: No results for input(s): HGBA1C in the last 72 hours. CBG: No results for input(s): GLUCAP in the last 168 hours. Lipid Profile: No results for input(s): CHOL, HDL, LDLCALC, TRIG, CHOLHDL,  LDLDIRECT in the last 72 hours. Thyroid Function Tests: No results for input(s): TSH, T4TOTAL, FREET4, T3FREE, THYROIDAB in the last 72 hours. Anemia Panel: No results for input(s): VITAMINB12, FOLATE, FERRITIN, TIBC, IRON, RETICCTPCT in the last 72 hours. Sepsis Labs: No results for input(s): PROCALCITON, LATICACIDVEN in the last 168 hours.  No results found for this or any previous visit (from the past 240 hours).       Radiology Studies: DG Wrist Complete Left Result Date: 03/23/2024 CLINICAL DATA:  Status post reduction EXAM: LEFT WRIST - COMPLETE 3+ VIEW COMPARISON:  Film from earlier in the same day. FINDINGS: Previously seen distal radial fracture has been reduced. Some mild posterior angulation remains although the fracture fragments are in better alignment. No other focal abnormality is noted. IMPRESSION: Status post reduction Electronically Signed   By: Oneil Devonshire M.D.   On: 03/23/2024 19:35   CT Head Wo Contrast Result Date: 03/23/2024 CLINICAL DATA:  fall EXAM: CT HEAD WITHOUT CONTRAST CT CERVICAL SPINE WITHOUT CONTRAST TECHNIQUE: Multidetector CT imaging of the head and cervical spine was performed following the standard protocol without intravenous contrast. Multiplanar CT image reconstructions of the cervical spine were also generated.  RADIATION DOSE REDUCTION: This exam was performed according to the departmental dose-optimization program which includes automated exposure control, adjustment of the mA and/or kV according to patient size and/or use of iterative reconstruction technique. COMPARISON:  None Available. FINDINGS: CT HEAD FINDINGS Brain: No evidence of acute infarction, hemorrhage, hydrocephalus, extra-axial collection or mass lesion/mass effect. Patchy white matter hypodensities are nonspecific but compatible with chronic microvascular ischemic disease. Vascular: No hyperdense vessel. Skull: No acute fracture. Sinuses/Orbits: Clear sinuses.   No acute orbital findings. Other: No mastoid effusions. CT CERVICAL SPINE FINDINGS Alignment: Straightening.  No substantial sagittal subluxation. Skull base and vertebrae: No acute fracture. Soft tissues and spinal canal: No prevertebral fluid or swelling. No visible canal hematoma. Disc levels: Mild multilevel degenerative change. Upper chest: Lung apices are clear. IMPRESSION: No evidence of acute abnormality intracranially or in the cervical spine. Electronically Signed   By: Gilmore GORMAN Molt M.D.   On: 03/23/2024 17:04   CT Cervical Spine Wo Contrast Result Date: 03/23/2024 CLINICAL DATA:  fall EXAM: CT HEAD WITHOUT CONTRAST CT CERVICAL SPINE WITHOUT CONTRAST TECHNIQUE: Multidetector CT imaging of the head and cervical spine was performed following the standard protocol without intravenous contrast. Multiplanar CT image reconstructions of the cervical spine were also generated. RADIATION DOSE REDUCTION: This exam was performed according to the departmental dose-optimization program which includes automated exposure control, adjustment of the mA and/or kV according to patient size and/or use of iterative reconstruction technique. COMPARISON:  None Available. FINDINGS: CT HEAD FINDINGS Brain: No evidence of acute infarction, hemorrhage, hydrocephalus, extra-axial collection or mass lesion/mass effect. Patchy white matter hypodensities are nonspecific but compatible with chronic microvascular ischemic disease. Vascular: No hyperdense vessel. Skull: No acute fracture. Sinuses/Orbits: Clear sinuses.  No acute orbital findings. Other: No mastoid effusions. CT CERVICAL SPINE FINDINGS Alignment: Straightening.  No substantial sagittal subluxation. Skull base and vertebrae: No acute fracture. Soft tissues and spinal canal: No prevertebral fluid or swelling. No visible canal hematoma. Disc levels: Mild multilevel degenerative change. Upper chest: Lung apices are clear. IMPRESSION: No evidence of acute abnormality  intracranially or in the cervical spine. Electronically Signed   By: Gilmore GORMAN Molt M.D.   On: 03/23/2024 17:04   CT LUMBAR SPINE WO CONTRAST Result Date: 03/23/2024 EXAM: CT OF THE LUMBAR SPINE WITHOUT CONTRAST 03/23/2024 03:20:00 PM TECHNIQUE: CT of the lumbar spine was performed without the administration of intravenous contrast. Multiplanar reformatted images are provided for review. Automated exposure control, iterative reconstruction, and/or weight based adjustment of the mA/kV was utilized to reduce the radiation dose to as low as reasonably achievable. COMPARISON: None available. CLINICAL HISTORY: Pt comes via EMS from home with c/o fall. Pt had fall today and now having lower back pain and left wrist pain. Pt does have deformity. Pt denies any hitting head. 50 mcg fent to right arm. 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 Hyperplastic ribs at T12. Grade 1 anterolisthesis at L4-5 due to facet arthrosis. T12 wedge compression fracture with 15% height loss and no retropulsion. DEGENERATIVE CHANGES:\ No bony spinal canal stenosis. SOFT TISSUES: Calcific aortic atherosclerosis. No acute abnormality. IMPRESSION: 1. T12 wedge compression fracture with 15% height loss and no retropulsion. 2. Grade 1 anterolisthesis at L4-5 due to facet arthrosis. Electronically signed by: Franky Stanford MD 03/23/2024 05:00 PM EDT RP Workstation: [id number]   DG Wrist Complete Left Result Date: 03/23/2024 EXAM: 3 or more VIEW(S) XRAY OF THE LEFT WRIST 03/23/2024 02:51:00 PM COMPARISON: None available.  CLINICAL HISTORY: Fall. Pt comes via EMS from home with c/o fall. Pt had fall today and now having lower back pain and left wrist pain. Pt does have deformity. FINDINGS: BONES AND JOINTS: Comminuted and displaced distal radial metaphyseal fracture with apex volar angulation. There is involvement of the radiocarpal and distal radioulnar joints.  Suspect nondisplaced ulnar styloid fracture. Carpal bones are intact. SOFT TISSUES: Soft tissue edema is seen at the fracture site. IMPRESSION: 1. Comminuted and displaced distal radial metaphyseal fracture with apex volar angulation, involving the radiocarpal and distal radioulnar joints. 2. Suspected nondisplaced ulnar styloid fracture. Electronically signed by: Andrea Gasman MD 03/23/2024 03:48 PM EDT RP Workstation: [id number]        Scheduled Meds:  DULoxetine   90 mg Oral Daily   levETIRAcetam   750 mg Oral BID   lidocaine   1 patch Transdermal Q24H   metoprolol  tartrate  25 mg Oral Daily   Continuous Infusions:   LOS: 0 days    Calvin KATHEE Robson, MD [facility]   If 7PM-7AM, please contact night-coverage  03/24/2024, 2:59 PM

## 2024-03-24 NOTE — Care Management Obs Status (Signed)
 MEDICARE OBSERVATION STATUS NOTIFICATION   Patient Details  Name: Heather Salas MRN: [id number] Date of Birth: [birth year]   Medicare Observation Status Notification Given:  Yes    Rendy Lazard W, CMA 03/24/2024, 11:01 AM

## 2024-03-25 DIAGNOSIS — S62102D Fracture of unspecified carpal bone, left wrist, subsequent encounter for fracture with routine healing: Secondary | ICD-10-CM | POA: Diagnosis not present

## 2024-03-25 LAB — URINALYSIS, COMPLETE (UACMP) WITH MICROSCOPIC
Bilirubin Urine: NEGATIVE
Glucose, UA: NEGATIVE mg/dL
Ketones, ur: 5 mg/dL — AB
Nitrite: NEGATIVE
Protein, ur: 30 mg/dL — AB
Specific Gravity, Urine: 1.03 (ref 1.005–1.030)
pH: 5 (ref 5.0–8.0)

## 2024-03-25 NOTE — NC FL2 (Signed)
 [place]  MEDICAID FL2 LEVEL OF CARE FORM     IDENTIFICATION  Patient Name: Heather Salas Birthdate: 1956-06-10 Sex: female Admission Date (Current Location): 03/23/2024  [facility] and [place] Number:  Chiropodist and Address:  [facility], [address], [place], [place] [address]      Provider Number: [id number]  Attending Physician Name and Address:  Jhonny Calvin NOVAK, MD  Relative Name and Phone Number:       Current Level of Care: Hospital Recommended Level of Care: Skilled Nursing Facility Prior Approval Number:    Date Approved/Denied:   PASRR Number: [id number]  Discharge Plan: SNF    Current Diagnoses: Patient Active Problem List   Diagnosis Date Noted   Closed fracture of left distal radius 03/24/2024   Left wrist fracture 03/23/2024   Fall at home, initial encounter 03/23/2024   T12 compression fracture (HCC) 03/23/2024   Depression with anxiety 03/23/2024   Chronic kidney disease, stage 3a (HCC) 03/23/2024   Seizures (HCC)    Rheumatoid arthritis (HCC)    Fibromyalgia    HTN (hypertension)    HLD (hyperlipidemia)     Orientation RESPIRATION BLADDER Height & Weight     Self, Time, Situation, Place  Normal Continent Weight: 169 lb 15.6 oz (77.1 kg) Height:  5' 3 (160 cm)  BEHAVIORAL SYMPTOMS/MOOD NEUROLOGICAL BOWEL NUTRITION STATUS      Continent Diet (Regular)  AMBULATORY STATUS COMMUNICATION OF NEEDS Skin   Extensive Assist Verbally Normal                       Personal Care Assistance Level of Assistance  Bathing, Feeding, Dressing Bathing Assistance: Limited assistance Feeding assistance: Independent Dressing Assistance: Limited assistance     Functional Limitations Info             SPECIAL CARE FACTORS FREQUENCY  PT (By licensed PT), OT (By licensed OT)     PT Frequency: 5x/week OT Frequency: 5x/week            Contractures      Additional Factors Info  Allergies, Code Status  Code Status Info: Full Allergies Info: Celecoxib, Dilantin (Phenytoin)           Current Medications (03/25/2024):  This is the current hospital active medication list Current Facility-Administered Medications  Medication Dose Route Frequency Provider Last Rate Last Admin   acetaminophen  (TYLENOL ) tablet 650 mg  650 mg Oral Q6H PRN Niu, Xilin, MD       clonazePAM  (KLONOPIN ) tablet 0.5 mg  0.5 mg Oral BID PRN Niu, Xilin, MD       DULoxetine  (CYMBALTA ) DR capsule 90 mg  90 mg Oral Daily Niu, Xilin, MD   90 mg at 03/25/24 9072   enoxaparin  (LOVENOX ) injection 40 mg  40 mg Subcutaneous QHS Sreenath, Sudheer B, MD   40 mg at 03/24/24 2108   levETIRAcetam  (KEPPRA ) tablet 750 mg  750 mg Oral BID Niu, Xilin, MD   750 mg at 03/25/24 9072   lidocaine  (LIDODERM ) 5 % 1 patch  1 patch Transdermal Q24H Niu, Xilin, MD   1 patch at 03/24/24 2109   methocarbamol  (ROBAXIN ) tablet 500 mg  500 mg Oral Q8H PRN Niu, Xilin, MD   500 mg at 03/24/24 2108   metoprolol  tartrate (LOPRESSOR ) tablet 25 mg  25 mg Oral Daily Niu, Xilin, MD   25 mg at 03/25/24 9072   midazolam  PF (VERSED ) injection 2 mg  2 mg Intravenous  Q1H PRN Niu, Xilin, MD       morphine  (PF) 2 MG/ML injection 2 mg  2 mg Intravenous Q4H PRN Niu, Xilin, MD   2 mg at 03/24/24 0519   ondansetron  (ZOFRAN ) injection 4 mg  4 mg Intravenous Q8H PRN Niu, Xilin, MD   4 mg at 03/24/24 0519   oxyCODONE -acetaminophen  (PERCOCET/ROXICET) 5-325 MG per tablet 1 tablet  1 tablet Oral Q4H PRN Niu, Xilin, MD   1 tablet at 03/25/24 0201     Discharge Medications: Please see discharge summary for a list of discharge medications.  Relevant Imaging Results:  Relevant Lab Results:   Additional Information SSN: [id number]  Heather Laurel  [place], LCSW

## 2024-03-25 NOTE — Progress Notes (Signed)
 Inpatient Rehab Admissions Coordinator:   Per therapy recommendations, patient was screened for CIR candidacy by Leita Kleine, MS, CCC-SLP. At this time, Pt. does not appear to demonstrate functional deficts to justify in hospital rehabilitation/CIR. I will not pursue a rehab consult for this Pt.   Recommend other rehab venues to be pursued.  Please contact me with any questions.   Leita Kleine, MS, CCC-SLP Rehab Admissions Coordinator  [phone] (celll) [phone] (office)

## 2024-03-25 NOTE — Progress Notes (Signed)
 Occupational Therapy Treatment Patient Details Name: Heather Salas MRN: [id number] DOB: 23-May-1956 Today's Date: 03/25/2024   History of present illness 68 y/o female presented to ED on 03/23/24 after fall with L wrist pain and lower back pain. Sustained comminuted and displaced L radial fracture and T12 wedge compression fx with 15% height loss. PMH: seizure, tremor, HTN, RA, fibromyalgia, depression with anxiety, CKD 3a   OT comments  Pt is seated in recliner on arrival. Pleasant and agreeable to OT session. She reports some abdominal discomfort-notified MD and nurse as pt is requesting a urine sample. Reports back and wrist pain seem to be well managed on current pain meds. TLSO already on upon entry. She required CGA for STS and ambulation within the room. She was edu on compensatory strategies for LB ADL management while adhering to back precautions with good carryover. CGA for LB dressing and bathing seated/standing at EOB. CGA for toilet transfer to comfort height toilet and supervision to manage hygiene. Pt edu on how to doff TLSO prior to return to bed and able to perform with Min/Mod A. Pt returned to bed with all needs in place and will cont to require skilled acute OT services to maximize her safety and IND to return to PLOF.       If plan is discharge home, recommend the following:  A little help with walking and/or transfers;Assistance with cooking/housework;A little help with bathing/dressing/bathroom   Equipment Recommendations  BSC/3in1;Other (comment)    Recommendations for Other Services      Precautions / Restrictions Precautions Precautions: Fall;Back Precaution Booklet Issued: No Recall of Precautions/Restrictions: Intact Required Braces or Orthoses: Spinal Brace Spinal Brace: Thoracolumbosacral orthotic;Applied in sitting position Restrictions Weight Bearing Restrictions Per Provider Order: Yes LUE Weight Bearing Per Provider Order: Non weight bearing        Mobility Bed Mobility Overal bed mobility: Needs Assistance Bed Mobility: Sit to Supine       Sit to supine: Contact guard assist        Transfers Overall transfer level: Needs assistance Equipment used: 1 person hand held assist Transfers: Sit to/from Stand Sit to Stand: Contact guard assist                 Balance Overall balance assessment: Needs assistance Sitting-balance support: No upper extremity supported, Feet supported Sitting balance-Leahy Scale: Good     Standing balance support: No upper extremity supported, During functional activity Standing balance-Leahy Scale: Fair                             ADL either performed or assessed with clinical judgement   ADL Overall ADL's : Needs assistance/impaired             Lower Body Bathing: Sitting/lateral leans;Sit to/from stand;Contact guard assist   Upper Body Dressing : Moderate assistance;Cueing for sequencing;Minimal assistance Upper Body Dressing Details (indicate cue type and reason): to doff TLSO at EOB prior to returning to supine Lower Body Dressing: Contact guard assist;Adhering to back precautions;Sitting/lateral leans;Sit to/from stand;Cueing for back precautions Lower Body Dressing Details (indicate cue type and reason): to doff soiled underwear and donn clean ones Toilet Transfer: Contact guard assist;Comfort height toilet;Rolling walker (2 wheels)   Toileting- Clothing Manipulation and Hygiene: Supervision/safety;Sitting/lateral lean       Functional mobility during ADLs: Contact guard assist      Extremity/Trunk Assessment  Vision       Perception     Praxis     Communication Communication Communication: No apparent difficulties   Cognition Arousal: Alert Behavior During Therapy: WFL for tasks assessed/performed                                 Following commands: Intact        Cueing      Exercises Other Exercises Other  Exercises: Edu on LB ADL management while adhering to back precautions and no use of LUE with good carryover.    Shoulder Instructions       General Comments      Pertinent Vitals/ Pain       Pain Assessment Pain Assessment: Faces Faces Pain Scale: Hurts little more Pain Location: abdominal cramps Pain Descriptors / Indicators: Discomfort, Guarding Pain Intervention(s): Monitored during session, Limited activity within patient's tolerance, Repositioned (notified MD and nurse)  Home Living                                          Prior Functioning/Environment              Frequency  Min 2X/week        Progress Toward Goals  OT Goals(current goals can now be found in the care plan section)  Progress towards OT goals: Progressing toward goals  Acute Rehab OT Goals Patient Stated Goal: improve function OT Goal Formulation: With patient Time For Goal Achievement: 04/07/24 Potential to Achieve Goals: Good  Plan      Co-evaluation                 AM-PAC OT 6 Clicks Daily Activity     Outcome Measure   Help from another person eating meals?: None Help from another person taking care of personal grooming?: A Little Help from another person toileting, which includes using toliet, bedpan, or urinal?: A Little Help from another person bathing (including washing, rinsing, drying)?: A Little Help from another person to put on and taking off regular upper body clothing?: A Little Help from another person to put on and taking off regular lower body clothing?: A Little 6 Click Score: 19    End of Session Equipment Utilized During Treatment: Back brace  OT Visit Diagnosis: Other abnormalities of gait and mobility (R26.89);Muscle weakness (generalized) (M62.81);Pain Pain - Right/Left: Left Pain - part of body: Arm   Activity Tolerance Patient tolerated treatment well   Patient Left in bed;with call bell/phone within reach;with bed alarm  set   Nurse Communication Mobility status        Time: 1008-1040 OT Time Calculation (min): 32 min  Charges: OT General Charges $OT Visit: 1 Visit OT Treatments $Self Care/Home Management : 23-37 mins  Leonardo Makris, OTR/L  03/25/24, 12:08 PM   [name] 03/25/2024, 12:06 PM

## 2024-03-25 NOTE — Progress Notes (Signed)
 PROGRESS NOTE    Heather Salas  FMW:[id number] DOB: 1955/10/31 DOA: 03/23/2024 PCP: Ashley Boby LABOR, MD    Brief Narrative:  68 y.o. female with medical history significant of seizure, tremor, HTN, palpitation, HLD,  rheumatoid arthritis, fibromyalgia, depression with anxiety, CKD-3a who presents with fall, left wrist pain and lower back pain.    Pt states that she accidentally fell when she was trying to get a furniture mover device for her neighbors from closet around noon. She fell backward, injured her left wrist and lower back, causing pain in left wrist and lower back.  No loss of consciousness.  The pain is constant, sharp, severe, not radiating, aggravated by movement.  No leg numbness or weakness.  Denies head or neck injury.  No loss control of bladder or bowel movement. Patient has nausea, no vomiting, diarrhea or abdominal pain.  No symptoms of UTI.  No chest pain, cough, SOB.   Assessment & Plan:   Principal Problem:   Left wrist fracture Active Problems:   T12 compression fracture (HCC)   Fall at home, initial encounter   HTN (hypertension)   HLD (hyperlipidemia)   Chronic kidney disease, stage 3a (HCC)   Rheumatoid arthritis (HCC)   Seizures (HCC)   Depression with anxiety   Closed fracture of left distal radius  Left wrist fracture:  X-ray showed comminuted and displaced distal radial metaphyseal fracture with apex volar angulation, involving the radiocarpal and distal radioulnar joints and suspected nondisplaced ulnar styloid fracture. Consulted Dr. Lorelle of ortho.  Reduced by EDP in ER Plan: Nonoperative management Pain control Given patient's level of debility with the T12 compression fracture and left wrist fracture she will benefit from skilled nursing facility as to the next level of care.  TOC made aware.  Will get bed choices  T12 compression fracture [facility]): CT showed T12 wedge compression fracture with 15% height loss and no retropulsion.  Patient does  not want to surgery for this issue.  I did not consult neurosurgeon. Plan: TLSO when up and out of bed Multimodal pain control Skilled nursing facility referral I recommend skilled nursing facility as a next level of care   Fall at home, initial encounter Fall precautions Therapy evaluation   HTN (hypertension) PTA metoprolol  IV hydralazine as needed   HLD (hyperlipidemia):  Patient's not taking Crestor currently Outpatient follow-up   Chronic kidney disease, stage 3a (HCC):  Renal function close to baseline.  Recent baseline creatinine 1.1 on 11/20/2023.  Her creatinine 1.11, BUN 15, GFR 55. Creatinine at baseline   Rheumatoid arthritis [facility]): per his rheumatologist clinic note on 11/20/23 as below, patient is on Rinvoq  and methotrexate, but patient is not sure which med she is taking, will need to clarify with her rheumatologist - Pending clarification Per rheumatology PE, Elsie Schaffer Defoor's note on 11/20/23  -Methotrexate - current - Humira - caused a reaction involving left arm swelling - Plaquenil - was taking in the past, cannot recall what happened but does not recall there being an issue with the medication.  - Enbrel - loss of efficacy - Cymbalta  - current - Gabapentin - current - Rinvoq  - curren   Seizures (HCC) -Seizure precaution - As needed Versed  for seizure - Continue Keppra  750 mg twice daily   Depression with anxiety - Continue Cymbalta    DVT prophylaxis: SQ Lovenox  Code Status: Full Family Communication: Husband via phone 8/25 Disposition Plan: Status is: Observation The patient will require care spanning > 2 midnights and should be  moved to inpatient because: Continued pain.  Ambulatory dysfunction.  Will benefit from skilled nursing facility as next level of care.   Level of care: Med-Surg  Consultants:  Orthopedics  Procedures:  None  Antimicrobials: None   Subjective: Seen and examined.  Resting in bed.  Reports some left upper  extremity pain and lower back pain.  Objective: Vitals:   03/24/24 1447 03/24/24 2058 03/25/24 0502 03/25/24 0737  BP: (!) 146/72 (!) 140/95 (!) 141/75 139/80  Pulse: 72 69 85 70  Resp: 16 18 18 18   Temp: 98.4 F (36.9 C) 98.1 F (36.7 C) 98.1 F (36.7 C) 98.4 F (36.9 C)  TempSrc: Oral   Oral  SpO2: 96% 97% 94% 94%  Weight:      Height:        Intake/Output Summary (Last 24 hours) at 03/25/2024 1237 Last data filed at 03/25/2024 0955 Gross per 24 hour  Intake 480 ml  Output --  Net 480 ml   Filed Weights   03/23/24 2302  Weight: 77.1 kg    Examination:  General exam: No acute distress Respiratory system: Lungs clear.  Normal work of breathing.  Room air Cardiovascular system: S1 S2, RRR, no murmurs, no pedal edema Gastrointestinal system: Soft, NT/ND, normal bowel sounds Central nervous system: Alert and oriented. No focal neurological deficits. Extremities: Low back pain.  Left wrist in splint, decreased ROM, gait not assessed Skin: No rashes, lesions or ulcers Psychiatry: Judgement and insight appear normal. Mood & affect appropriate.     Data Reviewed: I have personally reviewed following labs and imaging studies  CBC: Recent Labs  Lab 03/23/24 1922 03/24/24 0439  WBC 9.1 5.9  HGB 12.6 11.8*  HCT 38.8 36.5  MCV 92.2 91.5  PLT 249 233   Basic Metabolic Panel: Recent Labs  Lab 03/23/24 1922  NA 139  K 4.1  CL 105  CO2 25  GLUCOSE 113*  BUN 15  CREATININE 1.11*  CALCIUM 9.4   GFR: Estimated Creatinine Clearance: 49.7 mL/min (A) (by C-G formula based on SCr of 1.11 mg/dL (H)). Liver Function Tests: No results for input(s): AST, ALT, ALKPHOS, BILITOT, PROT, ALBUMIN in the last 168 hours. No results for input(s): LIPASE, AMYLASE in the last 168 hours. No results for input(s): AMMONIA in the last 168 hours. Coagulation Profile: Recent Labs  Lab 03/23/24 2131  INR 1.0   Cardiac Enzymes: No results for input(s): CKTOTAL,  CKMB, CKMBINDEX, TROPONINI in the last 168 hours. BNP (last 3 results) No results for input(s): PROBNP in the last 8760 hours. HbA1C: No results for input(s): HGBA1C in the last 72 hours. CBG: No results for input(s): GLUCAP in the last 168 hours. Lipid Profile: No results for input(s): CHOL, HDL, LDLCALC, TRIG, CHOLHDL, LDLDIRECT in the last 72 hours. Thyroid Function Tests: No results for input(s): TSH, T4TOTAL, FREET4, T3FREE, THYROIDAB in the last 72 hours. Anemia Panel: No results for input(s): VITAMINB12, FOLATE, FERRITIN, TIBC, IRON, RETICCTPCT in the last 72 hours. Sepsis Labs: No results for input(s): PROCALCITON, LATICACIDVEN in the last 168 hours.  No results found for this or any previous visit (from the past 240 hours).       Radiology Studies: DG Wrist Complete Left Result Date: 03/23/2024 CLINICAL DATA:  Status post reduction EXAM: LEFT WRIST - COMPLETE 3+ VIEW COMPARISON:  Film from earlier in the same day. FINDINGS: Previously seen distal radial fracture has been reduced. Some mild posterior angulation remains although the fracture fragments are in better alignment.  No other focal abnormality is noted. IMPRESSION: Status post reduction Electronically Signed   By: Oneil Devonshire M.D.   On: 03/23/2024 19:35   CT Head Wo Contrast Result Date: 03/23/2024 CLINICAL DATA:  fall EXAM: CT HEAD WITHOUT CONTRAST CT CERVICAL SPINE WITHOUT CONTRAST TECHNIQUE: Multidetector CT imaging of the head and cervical spine was performed following the standard protocol without intravenous contrast. Multiplanar CT image reconstructions of the cervical spine were also generated. RADIATION DOSE REDUCTION: This exam was performed according to the departmental dose-optimization program which includes automated exposure control, adjustment of the mA and/or kV according to patient size and/or use of iterative reconstruction technique. COMPARISON:  None  Available. FINDINGS: CT HEAD FINDINGS Brain: No evidence of acute infarction, hemorrhage, hydrocephalus, extra-axial collection or mass lesion/mass effect. Patchy white matter hypodensities are nonspecific but compatible with chronic microvascular ischemic disease. Vascular: No hyperdense vessel. Skull: No acute fracture. Sinuses/Orbits: Clear sinuses.  No acute orbital findings. Other: No mastoid effusions. CT CERVICAL SPINE FINDINGS Alignment: Straightening.  No substantial sagittal subluxation. Skull base and vertebrae: No acute fracture. Soft tissues and spinal canal: No prevertebral fluid or swelling. No visible canal hematoma. Disc levels: Mild multilevel degenerative change. Upper chest: Lung apices are clear. IMPRESSION: No evidence of acute abnormality intracranially or in the cervical spine. Electronically Signed   By: Gilmore GORMAN Molt M.D.   On: 03/23/2024 17:04   CT Cervical Spine Wo Contrast Result Date: 03/23/2024 CLINICAL DATA:  fall EXAM: CT HEAD WITHOUT CONTRAST CT CERVICAL SPINE WITHOUT CONTRAST TECHNIQUE: Multidetector CT imaging of the head and cervical spine was performed following the standard protocol without intravenous contrast. Multiplanar CT image reconstructions of the cervical spine were also generated. RADIATION DOSE REDUCTION: This exam was performed according to the departmental dose-optimization program which includes automated exposure control, adjustment of the mA and/or kV according to patient size and/or use of iterative reconstruction technique. COMPARISON:  None Available. FINDINGS: CT HEAD FINDINGS Brain: No evidence of acute infarction, hemorrhage, hydrocephalus, extra-axial collection or mass lesion/mass effect. Patchy white matter hypodensities are nonspecific but compatible with chronic microvascular ischemic disease. Vascular: No hyperdense vessel. Skull: No acute fracture. Sinuses/Orbits: Clear sinuses.  No acute orbital findings. Other: No mastoid effusions. CT  CERVICAL SPINE FINDINGS Alignment: Straightening.  No substantial sagittal subluxation. Skull base and vertebrae: No acute fracture. Soft tissues and spinal canal: No prevertebral fluid or swelling. No visible canal hematoma. Disc levels: Mild multilevel degenerative change. Upper chest: Lung apices are clear. IMPRESSION: No evidence of acute abnormality intracranially or in the cervical spine. Electronically Signed   By: Gilmore GORMAN Molt M.D.   On: 03/23/2024 17:04   CT LUMBAR SPINE WO CONTRAST Result Date: 03/23/2024 EXAM: CT OF THE LUMBAR SPINE WITHOUT CONTRAST 03/23/2024 03:20:00 PM TECHNIQUE: CT of the lumbar spine was performed without the administration of intravenous contrast. Multiplanar reformatted images are provided for review. Automated exposure control, iterative reconstruction, and/or weight based adjustment of the mA/kV was utilized to reduce the radiation dose to as low as reasonably achievable. COMPARISON: None available. CLINICAL HISTORY: Pt comes via EMS from home with c/o fall. Pt had fall today and now having lower back pain and left wrist pain. Pt does have deformity. Pt denies any hitting head. 50 mcg fent to right arm. 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 Hyperplastic ribs at T12. Grade 1 anterolisthesis at L4-5 due to facet arthrosis. T12 wedge compression fracture with 15% height loss and no  retropulsion. DEGENERATIVE CHANGES:\ No bony spinal canal stenosis. SOFT TISSUES: Calcific aortic atherosclerosis. No acute abnormality. IMPRESSION: 1. T12 wedge compression fracture with 15% height loss and no retropulsion. 2. Grade 1 anterolisthesis at L4-5 due to facet arthrosis. Electronically signed by: Franky Stanford MD 03/23/2024 05:00 PM EDT RP Workstation: [id number]   DG Wrist Complete Left Result Date: 03/23/2024 EXAM: 3 or more VIEW(S) XRAY OF THE LEFT WRIST 03/23/2024 02:51:00 PM COMPARISON: None  available. CLINICAL HISTORY: Fall. Pt comes via EMS from home with c/o fall. Pt had fall today and now having lower back pain and left wrist pain. Pt does have deformity. FINDINGS: BONES AND JOINTS: Comminuted and displaced distal radial metaphyseal fracture with apex volar angulation. There is involvement of the radiocarpal and distal radioulnar joints. Suspect nondisplaced ulnar styloid fracture. Carpal bones are intact. SOFT TISSUES: Soft tissue edema is seen at the fracture site. IMPRESSION: 1. Comminuted and displaced distal radial metaphyseal fracture with apex volar angulation, involving the radiocarpal and distal radioulnar joints. 2. Suspected nondisplaced ulnar styloid fracture. Electronically signed by: Andrea Gasman MD 03/23/2024 03:48 PM EDT RP Workstation: [id number]        Scheduled Meds:  DULoxetine   90 mg Oral Daily   enoxaparin  (LOVENOX ) injection  40 mg Subcutaneous QHS   levETIRAcetam   750 mg Oral BID   lidocaine   1 patch Transdermal Q24H   metoprolol  tartrate  25 mg Oral Daily   Continuous Infusions:   LOS: 0 days    Calvin KATHEE Robson, MD [facility]   If 7PM-7AM, please contact night-coverage  03/25/2024, 12:37 PM

## 2024-03-25 NOTE — Progress Notes (Signed)
 Physical Therapy Treatment Patient Details Name: Heather Salas MRN: [id number] DOB: May 24, 1956 Today's Date: 03/25/2024   History of Present Illness 68 y/o female presented to ED on 03/23/24 after fall with L wrist pain and lower back pain. Sustained comminuted and displaced L radial fracture and T12 wedge compression fx with 15% height loss. PMH: seizure, tremor, HTN, RA, fibromyalgia, depression with anxiety, CKD 3a    PT Comments  Pt seen for PT tx with pt agreeable. Pt demonstrates good ability to log roll with min assist, HOB elevated. Pt requires assistance to don TLSO & recall 3/3 back precautions (pt recalls 2/3). Pt ambulates with SPC with CGA, no overt LOB, but decreased foot clearance & heel strike LLE. Pt reports her husband can provide supervision at d/c & assist with meals, but cannot provide physical assistance. Recommend ongoing PT services to progress mobility as able. Pt would benefit from post acute rehab >3 hours therapy/day upon d/c to help facilitate return to independent PLOF.    If plan is discharge home, recommend the following: A little help with walking and/or transfers;A little help with bathing/dressing/bathroom;Assistance with cooking/housework;Assist for transportation;Help with stairs or ramp for entrance   Can travel by private vehicle        Equipment Recommendations  Cane;BSC/3in1    Recommendations for Other Services Rehab consult     Precautions / Restrictions Precautions Precautions: Fall;Back Required Braces or Orthoses: Spinal Brace Spinal Brace: Thoracolumbosacral orthotic;Applied in sitting position Restrictions Weight Bearing Restrictions Per Provider Order: Yes LUE Weight Bearing Per Provider Order: Non weight bearing     Mobility  Bed Mobility Overal bed mobility: Needs Assistance Bed Mobility: Supine to Sit Rolling: Supervision, Used rails (HOB elevated) Sidelying to sit: Min assist, HOB elevated, Used rails (holds to PT's hand to aide  in sitting trunk upright)            Transfers Overall transfer level: Needs assistance Equipment used: Straight cane, None Transfers: Sit to/from Stand Sit to Stand: Min assist           General transfer comment: sit>stand from EOB    Ambulation/Gait Ambulation/Gait assistance: Contact guard assist Gait Distance (Feet): 100 Feet Assistive device: Straight cane Gait Pattern/deviations: Decreased step length - left, Decreased step length - right, Decreased dorsiflexion - left, Decreased stride length Gait velocity: decreased     General Gait Details: decreased foot clearance LLE, decreased heel strike, pt reports one of her legs is longer than the other but she's not sure which, when PT pointed out decreased foot clearance LLE pt initiated correcting it without cuing   Stairs             Wheelchair Mobility     Tilt Bed    Modified Rankin (Stroke Patients Only)       Balance Overall balance assessment: Needs assistance Sitting-balance support: No upper extremity supported, Feet supported Sitting balance-Leahy Scale: Good Sitting balance - Comments: static sitting EOB   Standing balance support: During functional activity, Single extremity supported Standing balance-Leahy Scale: Fair                              Hotel manager: No apparent difficulties  Cognition Arousal: Alert Behavior During Therapy: WFL for tasks assessed/performed   PT - Cognitive impairments: Memory                       PT - Cognition Comments:  decreased recall of back precautions Following commands: Intact      Cueing Cueing Techniques: Verbal cues  Exercises Other Exercises Other Exercises: Pt performed 5x sit<>stand from EOB without BUE support with min assist with cuing re: need for increased anterior weight shifting as able, wide BOS, but pt still leaning posteriorly on EOB with BLE.    General Comments General  comments (skin integrity, edema, etc.): PT assisted pt with donning TLSO but pt recalled when she needed to don it, reviewed back precautions with pt recalling 2/3 with extra time & min cuing      Pertinent Vitals/Pain Pain Assessment Pain Assessment: 0-10 Pain Score: 7  Pain Location: back, lower abdominal cramping as well Pain Descriptors / Indicators: Discomfort, Cramping Pain Intervention(s): Monitored during session (notified MD & Nurse of pt's c/o)    Home Living                          Prior Function            PT Goals (current goals can now be found in the care plan section) Acute Rehab PT Goals Patient Stated Goal: to go home PT Goal Formulation: With patient Time For Goal Achievement: 04/07/24 Potential to Achieve Goals: Fair Progress towards PT goals: Progressing toward goals    Frequency    Min 3X/week      PT Plan      Co-evaluation              AM-PAC PT 6 Clicks Mobility   Outcome Measure  Help needed turning from your back to your side while in a flat bed without using bedrails?: A Little Help needed moving from lying on your back to sitting on the side of a flat bed without using bedrails?: A Little Help needed moving to and from a bed to a chair (including a wheelchair)?: A Little Help needed standing up from a chair using your arms (e.g., wheelchair or bedside chair)?: A Little Help needed to walk in hospital room?: A Little Help needed climbing 3-5 steps with a railing? : A Little 6 Click Score: 18    End of Session Equipment Utilized During Treatment: Back brace Activity Tolerance: Patient tolerated treatment well Patient left: in chair;with call bell/phone within reach Nurse Communication: Mobility status;Patient requests pain meds PT Visit Diagnosis: Unsteadiness on feet (R26.81);Muscle weakness (generalized) (M62.81);Difficulty in walking, not elsewhere classified (R26.2);Pain;Other abnormalities of gait and mobility  (R26.89) Pain - part of body:  (back & lower abdomen)     Time: 8480-8466 PT Time Calculation (min) (ACUTE ONLY): 14 min  Charges:    $Therapeutic Activity: 8-22 mins PT General Charges $$ ACUTE PT VISIT: 1 Visit                     Richerd Pinal, PT, DPT 03/25/24, 3:41 PM   Richerd CHRISTELLA Pinal 03/25/2024, 3:39 PM

## 2024-03-26 ENCOUNTER — Other Ambulatory Visit: Payer: Self-pay

## 2024-03-26 DIAGNOSIS — S62102D Fracture of unspecified carpal bone, left wrist, subsequent encounter for fracture with routine healing: Secondary | ICD-10-CM | POA: Diagnosis not present

## 2024-03-26 MED ORDER — OXYCODONE HCL 5 MG PO TABS
5.0000 mg | ORAL_TABLET | Freq: Three times a day (TID) | ORAL | 0 refills | Status: AC | PRN
  Filled 2024-03-26: qty 15, 5d supply, fill #0

## 2024-03-26 MED ORDER — POLYETHYLENE GLYCOL 3350 17 GM/SCOOP PO POWD
17.0000 g | Freq: Every day | ORAL | 1 refills | Status: AC
  Filled 2024-03-26: qty 510, 30d supply, fill #0

## 2024-03-26 NOTE — Discharge Summary (Signed)
 Heather Salas FMW:[id number] DOB: Mar 28, 1956 DOA: 03/23/2024  PCP: Ashley Boby LABOR, MD  Admit date: 03/23/2024 Discharge date: 03/26/2024  Time spent: 35 minutes  Recommendations for Outpatient Follow-up:  Ortho f/u next week Pcp f/u as well, consider osteoporosis evaluation/treatment  Rheumatology f/u as well    Discharge Diagnoses:  Principal Problem:   Left wrist fracture Active Problems:   T12 compression fracture (HCC)   Fall at home, initial encounter   HTN (hypertension)   HLD (hyperlipidemia)   Chronic kidney disease, stage 3a (HCC)   Rheumatoid arthritis (HCC)   Seizures (HCC)   Depression with anxiety   Closed fracture of left distal radius   Discharge Condition: improving  Diet recommendation: regular  Filed Weights   03/23/24 2302  Weight: 77.1 kg    History of present illness:  From admission h and p Heather Salas is a 68 y.o. female with medical history significant of seizure, tremor, HTN, palpitation, HLD,  rheumatoid arthritis, fibromyalgia, depression with anxiety, CKD-3a who presents with fall, left wrist pain and lower back pain.    Pt states that she accidentally fell when she was trying to get a furniture mover device for her neighbors from closet around noon. She fell backward, injured her left wrist and lower back, causing pain in left wrist and lower back.  No loss of consciousness.  The pain is constant, sharp, severe, not radiating, aggravated by movement.  No leg numbness or weakness.  Denies head or neck injury.  No loss control of bladder or bowel movement. Patient has nausea, no vomiting, diarrhea or abdominal pain.  No symptoms of UTI.  No chest pain, cough, SOB.  Hospital Course:   Patient presents after trip and fall suffering distal left radius fracture and t12 wedge fracture. The radius fracture was reduced in the ER, patient was evaluated by ortho who advises maintain splint and f/u in the office in one week, hoping for non-operative  mgmt. For the t12 fracture height loss 15% and no signs retropulsion, pain is controlled, has back brace which she is using for comfort, can f/u with ortho regarding this fracture as well. Do advise pcp f/u for this fragility fracture - treating for underlying osteoporosis is indicated. PT advising home health and patient is agreeable; orders including DME have been placed. Other chronic conditions stable. D/c plan reviewed with husband who is in agreement.   Procedures: none   Consultations: orthopedics  Discharge Exam: Vitals:   03/26/24 0212 03/26/24 0807  BP: (!) 152/85 136/82  Pulse: 82 71  Resp: 18 16  Temp: 98.6 F (37 C) 97.7 F (36.5 C)  SpO2: 99% 99%    General: NAD Cardiovascular: RRR Respiratory: CTAB Ext: left arm in splint and wrapped  Discharge Instructions   Discharge Instructions     Diet - low sodium heart healthy   Complete by: As directed    Increase activity slowly   Complete by: As directed       Allergies as of 03/26/2024       Reactions   Celecoxib Nausea Only   Dilantin [phenytoin] Hives        Medication List     STOP taking these medications    Enbrel 50 MG/ML injection Generic drug: etanercept   gabapentin 100 MG capsule Commonly known as: NEURONTIN   metoprolol  succinate 25 MG 24 hr tablet Commonly known as: TOPROL -XL   sertraline 50 MG tablet Commonly known as: ZOLOFT       TAKE  these medications    AIRBORNE PO Take by mouth daily.   artificial tears ointment as needed.   DULoxetine  30 MG capsule Commonly known as: CYMBALTA  Take 90 mg by mouth daily.   levETIRAcetam  750 MG tablet Commonly known as: KEPPRA  Take 750 mg by mouth 2 (two) times daily.   methotrexate 2.5 MG tablet Commonly known as: RHEUMATREX Take 20 mg by mouth once a week. Caution:Chemotherapy. Protect from light.   metoprolol  tartrate 25 MG tablet Commonly known as: LOPRESSOR  Take 25 mg by mouth daily.   Nasacort Allergy 24HR 55  MCG/ACT Aero nasal inhaler Generic drug: triamcinolone Place 2 sprays into the nose daily.   oxyCODONE  5 MG immediate release tablet Commonly known as: Roxicodone  Take 1 tablet (5 mg total) by mouth every 8 (eight) hours as needed.   polyethylene glycol 17 g packet Commonly known as: MiraLax  Take 17 g by mouth daily.   Rinvoq  15 MG Tb24 Generic drug: Upadacitinib  ER Take 15 mg by mouth daily at 12 noon.   Vitamin D (Ergocalciferol) 1.25 MG (50000 UNIT) Caps capsule Commonly known as: DRISDOL Take 50,000 Units by mouth every 7 (seven) days.       Allergies  Allergen Reactions   Celecoxib Nausea Only   Dilantin [Phenytoin] Hives    Follow-up Information     Lorelle Hussar, MD Follow up.   Specialty: Orthopedic Surgery Why: next week Contact information: [address] [place] [address] [phone]         Ashley Boby LABOR, MD Follow up.   Specialty: Family Medicine Why: in 1-2 weeks Contact information: [address] [place] [address] [facility] [place] [address] [phone]                  The results of significant diagnostics from this hospitalization (including imaging, microbiology, ancillary and laboratory) are listed below for reference.    Significant Diagnostic Studies: DG Wrist Complete Left Result Date: 03/23/2024 CLINICAL DATA:  Status post reduction EXAM: LEFT WRIST - COMPLETE 3+ VIEW COMPARISON:  Film from earlier in the same day. FINDINGS: Previously seen distal radial fracture has been reduced. Some mild posterior angulation remains although the fracture fragments are in better alignment. No other focal abnormality is noted. IMPRESSION: Status post reduction Electronically Signed   By: Oneil Devonshire M.D.   On: 03/23/2024 19:35   CT Head Wo Contrast Result Date: 03/23/2024 CLINICAL DATA:  fall EXAM: CT HEAD WITHOUT CONTRAST CT CERVICAL SPINE WITHOUT CONTRAST TECHNIQUE: Multidetector CT imaging of the head and cervical spine  was performed following the standard protocol without intravenous contrast. Multiplanar CT image reconstructions of the cervical spine were also generated. RADIATION DOSE REDUCTION: This exam was performed according to the departmental dose-optimization program which includes automated exposure control, adjustment of the mA and/or kV according to patient size and/or use of iterative reconstruction technique. COMPARISON:  None Available. FINDINGS: CT HEAD FINDINGS Brain: No evidence of acute infarction, hemorrhage, hydrocephalus, extra-axial collection or mass lesion/mass effect. Patchy white matter hypodensities are nonspecific but compatible with chronic microvascular ischemic disease. Vascular: No hyperdense vessel. Skull: No acute fracture. Sinuses/Orbits: Clear sinuses.  No acute orbital findings. Other: No mastoid effusions. CT CERVICAL SPINE FINDINGS Alignment: Straightening.  No substantial sagittal subluxation. Skull base and vertebrae: No acute fracture. Soft tissues and spinal canal: No prevertebral fluid or swelling. No visible canal hematoma. Disc levels: Mild multilevel degenerative change. Upper chest: Lung apices are clear. IMPRESSION: No evidence of acute abnormality intracranially or in the  cervical spine. Electronically Signed   By: Gilmore GORMAN Molt M.D.   On: 03/23/2024 17:04   CT Cervical Spine Wo Contrast Result Date: 03/23/2024 CLINICAL DATA:  fall EXAM: CT HEAD WITHOUT CONTRAST CT CERVICAL SPINE WITHOUT CONTRAST TECHNIQUE: Multidetector CT imaging of the head and cervical spine was performed following the standard protocol without intravenous contrast. Multiplanar CT image reconstructions of the cervical spine were also generated. RADIATION DOSE REDUCTION: This exam was performed according to the departmental dose-optimization program which includes automated exposure control, adjustment of the mA and/or kV according to patient size and/or use of iterative reconstruction technique.  COMPARISON:  None Available. FINDINGS: CT HEAD FINDINGS Brain: No evidence of acute infarction, hemorrhage, hydrocephalus, extra-axial collection or mass lesion/mass effect. Patchy white matter hypodensities are nonspecific but compatible with chronic microvascular ischemic disease. Vascular: No hyperdense vessel. Skull: No acute fracture. Sinuses/Orbits: Clear sinuses.  No acute orbital findings. Other: No mastoid effusions. CT CERVICAL SPINE FINDINGS Alignment: Straightening.  No substantial sagittal subluxation. Skull base and vertebrae: No acute fracture. Soft tissues and spinal canal: No prevertebral fluid or swelling. No visible canal hematoma. Disc levels: Mild multilevel degenerative change. Upper chest: Lung apices are clear. IMPRESSION: No evidence of acute abnormality intracranially or in the cervical spine. Electronically Signed   By: Gilmore GORMAN Molt M.D.   On: 03/23/2024 17:04   CT LUMBAR SPINE WO CONTRAST Result Date: 03/23/2024 EXAM: CT OF THE LUMBAR SPINE WITHOUT CONTRAST 03/23/2024 03:20:00 PM TECHNIQUE: CT of the lumbar spine was performed without the administration of intravenous contrast. Multiplanar reformatted images are provided for review. Automated exposure control, iterative reconstruction, and/or weight based adjustment of the mA/kV was utilized to reduce the radiation dose to as low as reasonably achievable. COMPARISON: None available. CLINICAL HISTORY: Pt comes via EMS from home with c/o fall. Pt had fall today and now having lower back pain and left wrist pain. Pt does have deformity. Pt denies any hitting head. 50 mcg fent to right arm. 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 Hyperplastic ribs at T12. Grade 1 anterolisthesis at L4-5 due to facet arthrosis. T12 wedge compression fracture with 15% height loss and no retropulsion. DEGENERATIVE CHANGES:\ No bony spinal canal stenosis. SOFT TISSUES:  Calcific aortic atherosclerosis. No acute abnormality. IMPRESSION: 1. T12 wedge compression fracture with 15% height loss and no retropulsion. 2. Grade 1 anterolisthesis at L4-5 due to facet arthrosis. Electronically signed by: Franky Stanford MD 03/23/2024 05:00 PM EDT RP Workstation: [id number]   DG Wrist Complete Left Result Date: 03/23/2024 EXAM: 3 or more VIEW(S) XRAY OF THE LEFT WRIST 03/23/2024 02:51:00 PM COMPARISON: None available. CLINICAL HISTORY: Fall. Pt comes via EMS from home with c/o fall. Pt had fall today and now having lower back pain and left wrist pain. Pt does have deformity. FINDINGS: BONES AND JOINTS: Comminuted and displaced distal radial metaphyseal fracture with apex volar angulation. There is involvement of the radiocarpal and distal radioulnar joints. Suspect nondisplaced ulnar styloid fracture. Carpal bones are intact. SOFT TISSUES: Soft tissue edema is seen at the fracture site. IMPRESSION: 1. Comminuted and displaced distal radial metaphyseal fracture with apex volar angulation, involving the radiocarpal and distal radioulnar joints. 2. Suspected nondisplaced ulnar styloid fracture. Electronically signed by: Andrea Gasman MD 03/23/2024 03:48 PM EDT RP Workstation: [id number]    Microbiology: No results found for this or any previous visit (from the past 240 hours).   Labs: Basic Metabolic Panel: Recent Labs  Lab 03/23/24  1922  NA 139  K 4.1  CL 105  CO2 25  GLUCOSE 113*  BUN 15  CREATININE 1.11*  CALCIUM 9.4   Liver Function Tests: No results for input(s): AST, ALT, ALKPHOS, BILITOT, PROT, ALBUMIN in the last 168 hours. No results for input(s): LIPASE, AMYLASE in the last 168 hours. No results for input(s): AMMONIA in the last 168 hours. CBC: Recent Labs  Lab 03/23/24 1922 03/24/24 0439  WBC 9.1 5.9  HGB 12.6 11.8*  HCT 38.8 36.5  MCV 92.2 91.5  PLT 249 233   Cardiac Enzymes: No results for input(s): CKTOTAL, CKMB,  CKMBINDEX, TROPONINI in the last 168 hours. BNP: BNP (last 3 results) No results for input(s): BNP in the last 8760 hours.  ProBNP (last 3 results) No results for input(s): PROBNP in the last 8760 hours.  CBG: No results for input(s): GLUCAP in the last 168 hours.     Signed:  Devaughn KATHEE Ban MD.  [facility] 03/26/2024, 9:35 AM

## 2024-03-26 NOTE — Progress Notes (Signed)
 Occupational Therapy Treatment Patient Details Name: Heather Salas MRN: [id number] DOB: Aug 14, 1955 Today's Date: 03/26/2024   History of present illness 68 y/o female presented to ED on 03/23/24 after fall with L wrist pain and lower back pain. Sustained comminuted and displaced L radial fracture and T12 wedge compression fx with 15% height loss. PMH: seizure, tremor, HTN, RA, fibromyalgia, depression with anxiety, CKD 3a   OT comments  Pt seen for OT treatment on this date. Upon arrival to room pt seated in recliner, agreeable to tx. Pt requires CGA on initial STS from the recliner, progressing to supervision as amb continued with good safety awareness and DME management of SPC in RUE. Pt amb into BR, voided with supervision for safety during seated pericare. Pt returned to the EOB for seated hair washing with use of a shower cap, pt required MODA for completion of task due to NWB status for LUE. Pt demonstrated good dynamic sitting balance throughout seated grooming task, MODA for doffing TLSO in sitting. Pt retired in supine with all needs in reach, RN at bedside. Pt making good progress toward goals, will continue to follow POC. Discharge recommendation remains appropriate.        If plan is discharge home, recommend the following:  A little help with walking and/or transfers;Assistance with cooking/housework;A little help with bathing/dressing/bathroom   Equipment Recommendations  BSC/3in1;Other (comment)    Recommendations for Other Services      Precautions / Restrictions Precautions Precautions: Fall;Back Recall of Precautions/Restrictions: Intact Required Braces or Orthoses: Spinal Brace Spinal Brace: Thoracolumbosacral orthotic;Applied in sitting position Restrictions Weight Bearing Restrictions Per Provider Order: Yes LUE Weight Bearing Per Provider Order: Non weight bearing       Mobility Bed Mobility Overal bed mobility: Modified Independent             General bed  mobility comments: Returned to bed at the end of the session with MODI    Transfers Overall transfer level: Needs assistance Equipment used: Straight cane Transfers: Sit to/from Stand Sit to Stand: Contact guard assist           General transfer comment: CGA progressing to supervision throughout amb     Balance Overall balance assessment: Needs assistance Sitting-balance support: No upper extremity supported, Feet supported Sitting balance-Leahy Scale: Good Sitting balance - Comments: static sitting EOB   Standing balance support: During functional activity, Single extremity supported Standing balance-Leahy Scale: Fair Standing balance comment: Good safety awareness during amb with straight cane                           ADL either performed or assessed with clinical judgement   ADL Overall ADL's : Needs assistance/impaired     Grooming: Wash/dry face;Brushing hair;Set up;Sitting;Moderate assistance Grooming Details (indicate cue type and reason): MODA for hair washing                 Toilet Transfer: Contact guard assist;Supervision/safety;Comfort height toilet Toilet Transfer Details (indicate cue type and reason): Cane utilized Psychiatrist and Hygiene: Supervision/safety;Sitting/lateral lean       Functional mobility during ADLs: Contact guard assist;Supervision/safety;Cane General ADL Comments: Completed hair washing while seated on the EOB with assistance due to NWB on the LUE     Communication Communication Communication: No apparent difficulties   Cognition Arousal: Alert Behavior During Therapy: WFL for tasks assessed/performed, Anxious (Anxious about family conflict at discharge) Cognition: No apparent impairments  OT - Cognition Comments: A/Ox4                 Following commands: Intact        Cueing   Cueing Techniques: Verbal cues  Exercises Exercises: Other exercises Other  Exercises Other Exercises: Edu: Role of OT, TLSO don/doff review, back precautions/adherence when completing ADL tasks    Shoulder Instructions       General Comments Pt stated anxious about family conflict and her husband assisting her with the TLSO. Author assist pt in texting video resource to reference once discharged home.    Pertinent Vitals/ Pain       Pain Assessment Pain Assessment: Faces Faces Pain Scale: Hurts a little bit Pain Location: back Pain Descriptors / Indicators: Discomfort Pain Intervention(s): Limited activity within patient's tolerance, Monitored during session                                                          Frequency  Min 2X/week        Progress Toward Goals  OT Goals(current goals can now be found in the care plan section)  Progress towards OT goals: Progressing toward goals  Acute Rehab OT Goals OT Goal Formulation: With patient Time For Goal Achievement: 04/07/24 Potential to Achieve Goals: Good ADL Goals Pt Will Perform Lower Body Bathing: with contact guard assist;with adaptive equipment;sitting/lateral leans;sit to/from stand Pt Will Perform Lower Body Dressing: with contact guard assist;sitting/lateral leans;sit to/from stand;with adaptive equipment Pt Will Transfer to Toilet: with modified independence;ambulating;regular height toilet  Plan      Co-evaluation                 AM-PAC OT 6 Clicks Daily Activity     Outcome Measure   Help from another person eating meals?: None Help from another person taking care of personal grooming?: None Help from another person toileting, which includes using toliet, bedpan, or urinal?: A Little Help from another person bathing (including washing, rinsing, drying)?: A Little Help from another person to put on and taking off regular upper body clothing?: A Little Help from another person to put on and taking off regular lower body clothing?: A Little 6  Click Score: 20    End of Session Equipment Utilized During Treatment: Gait belt;Other (comment) (SPC)  OT Visit Diagnosis: Other abnormalities of gait and mobility (R26.89);Muscle weakness (generalized) (M62.81);Pain Pain - Right/Left: Left Pain - part of body: Arm   Activity Tolerance Patient tolerated treatment well   Patient Left in bed;with call bell/phone within reach;with nursing/sitter in room   Nurse Communication Mobility status        Time: 9063-8983 OT Time Calculation (min): 40 min  Charges: OT General Charges $OT Visit: 1 Visit OT Treatments $Self Care/Home Management : 23-37 mins $Therapeutic Activity: 8-22 mins  Larraine Colas M.S. OTR/L  03/26/24, 10:37 AM

## 2024-03-26 NOTE — TOC CM/SW Note (Signed)
 Patient is not able to walk the distance required to go the bathroom, or he/she is unable to safely negotiate stairs required to access the bathroom.  A 3in1 BSC will alleviate this problem

## 2024-03-26 NOTE — TOC Transition Note (Signed)
 Transition of Care [facility]) - Discharge Note   Patient Details  Name: Heather Salas MRN: [id number] Date of Birth: 11/13/1955  [facility]) CM/SW Contact:  Alvaro Louder, LCSW Phone Number: 03/26/2024, 10:21 AM   Clinical Narrative:   ISRAEL discussed PT recommendation of HH Patient was agreeable. LCSWA reached out to [facility] admissions coordinator an started service for patient. The patients husband indicated that she will come pick her up at discharge. LCSWA discussed PT recommendation  of BSC 3in1. Patient was agreeable, LCSWA reached out to [employer] coordinator to set up equipment delivery.  TOC signing off  Final next level of care: Home w Home Health Services Barriers to Discharge: No Barriers Identified   Patient Goals and CMS Choice            Discharge Placement              Patient chooses bed at:  (Home) Patient to be transferred to facility by: Husband Name of family member notified: Oneil Patient and family notified of of transfer: 03/26/24  Discharge Plan and Services Additional resources added to the After Visit Summary for                  DME Arranged: Bedside commode DME Agency: [employer] Date DME Agency Contacted: 03/26/24   Representative spoke with at DME Agency: [place] HH Arranged: PT, OT HH Agency: [facility] Date [facility] Agency Contacted: 03/26/24   Representative spoke with at [facility] Agency: [place]   Social Drivers of Health (SDOH) Interventions SDOH Screenings   Food Insecurity: No Food Insecurity (03/23/2024)  Housing: Low Risk  (03/23/2024)  Transportation Needs: No Transportation Needs (03/23/2024)  Utilities: Not At Risk (03/23/2024)  Social Connections: Moderately Integrated (03/23/2024)  Tobacco Use: Medium Risk (03/23/2024)     Readmission Risk Interventions     No data to display

## 2024-03-26 NOTE — Progress Notes (Signed)
 Physical Therapy Treatment Patient Details Name: Heather Salas MRN: [id number] DOB: [birth year] Today's Date: 03/26/2024   History of Present Illness 68 y/o female presented to ED on 03/23/24 after fall with L wrist pain and lower back pain. Sustained comminuted and displaced L radial fracture and T12 wedge compression fx with 15% height loss. PMH: seizure, tremor, HTN, RA, fibromyalgia, depression with anxiety, CKD 3a    PT Comments  Pt was able to safely exit bed, stand, and tolerate PT session well. DC recs updated to reflect pt's progress. Discussed post DC needs with pt/pt's spouse.    If plan is discharge home, recommend the following: A little help with walking and/or transfers;A little help with bathing/dressing/bathroom;Assistance with cooking/housework;Assist for transportation;Help with stairs or ramp for entrance     Equipment Recommendations  BSC/3in1       Precautions / Restrictions Precautions Precautions: Fall;Back Precaution Booklet Issued: No Recall of Precautions/Restrictions: Intact Required Braces or Orthoses: Spinal Brace Spinal Brace: Thoracolumbosacral orthotic;Applied in sitting position Restrictions Weight Bearing Restrictions Per Provider Order: Yes LUE Weight Bearing Per Provider Order: Non weight bearing     Mobility  Bed Mobility Overal bed mobility: Modified Independent     Transfers Overall transfer level: Modified independent Equipment used: Straight cane Transfers: Sit to/from Stand Sit to Stand: Supervision     Ambulation/Gait Ambulation/Gait assistance: Supervision Gait Distance (Feet): 150 Feet Assistive device: Straight cane Gait Pattern/deviations: Step-through pattern Gait velocity: decreased  General Gait Details: pt easily able to ambulate with SPC     Balance Overall balance assessment: Needs assistance Sitting-balance support: No upper extremity supported, Feet supported Sitting balance-Leahy Scale: Good     Standing  balance support: During functional activity, Single extremity supported Standing balance-Leahy Scale: Good    Communication Communication Communication: No apparent difficulties  Cognition Arousal: Alert Behavior During Therapy: WFL for tasks assessed/performed    Following commands: Intact      Cueing Cueing Techniques: Verbal cues     General Comments General comments (skin integrity, edema, etc.): Pt stated anxious about family conflict and her husband assisting her with the TLSO. Author assist pt in texting video resource to reference once discharged home.      Pertinent Vitals/Pain Pain Assessment Pain Assessment: 0-10 Pain Score: 4  Pain Location: back Pain Descriptors / Indicators: Discomfort Pain Intervention(s): Limited activity within patient's tolerance, Monitored during session, Premedicated before session, Repositioned, Ice applied     PT Goals (current goals can now be found in the care plan section) Acute Rehab PT Goals Patient Stated Goal: to go home Progress towards PT goals: Progressing toward goals    Frequency    Min 3X/week       AM-PAC PT 6 Clicks Mobility   Outcome Measure  Help needed turning from your back to your side while in a flat bed without using bedrails?: A Little Help needed moving from lying on your back to sitting on the side of a flat bed without using bedrails?: A Little Help needed moving to and from a bed to a chair (including a wheelchair)?: A Little Help needed standing up from a chair using your arms (e.g., wheelchair or bedside chair)?: A Little Help needed to walk in hospital room?: A Little Help needed climbing 3-5 steps with a railing? : A Little 6 Click Score: 18    End of Session Equipment Utilized During Treatment: Back brace Activity Tolerance: Patient tolerated treatment well Patient left: in chair;with call bell/phone within reach Nurse Communication: Mobility  status;Patient requests pain meds PT Visit  Diagnosis: Unsteadiness on feet (R26.81);Muscle weakness (generalized) (M62.81);Difficulty in walking, not elsewhere classified (R26.2);Pain;Other abnormalities of gait and mobility (R26.89)     Time: 9173-9153 PT Time Calculation (min) (ACUTE ONLY): 20 min  Charges:    $Gait Training: 8-22 mins PT General Charges $$ ACUTE PT VISIT: 1 Visit                     Rankin Essex PTA 03/26/24, 12:17 PM

## 2024-03-26 NOTE — Progress Notes (Signed)
 Mobility Specialist Progress Note:    03/26/24 0846  Mobility  Activity Ambulated with assistance;Pivoted/transferred from bed to chair  Level of Assistance Contact guard assist, steadying assist  Assistive Device Cane  Distance Ambulated (ft) 200 ft  Range of Motion/Exercises Active;All extremities  LUE Weight Bearing Per Provider Order NWB  Activity Response Tolerated well  Mobility Referral Yes  Mobility visit 1 Mobility  Mobility Specialist Start Time (ACUTE ONLY) [id number]  Mobility Specialist Stop Time (ACUTE ONLY) 0846  Mobility Specialist Time Calculation (min) (ACUTE ONLY) 18 min   Pt agreeable to mobility. Required CGA to stand and ambulate with cane. Tolerated well, LUE NWB, rated pain 5/10. TLSO placed for ambulation. Returned to room, left pt in chair. Call bell in reach, all needs met.   Sherrilee Ditty Mobility Specialist Please contact via Special educational needs teacher or  Rehab office at [phone]
